# Patient Record
Sex: Male | Born: 1990 | Race: White | Hispanic: No | Marital: Single | Smoking: Current every day smoker
Health system: Southern US, Community
[De-identification: ages and names within clinical notes are randomized; demographics above are authoritative.]

## PROBLEM LIST (undated history)

## (undated) DIAGNOSIS — I10 Essential (primary) hypertension: Secondary | ICD-10-CM

## (undated) DIAGNOSIS — R011 Cardiac murmur, unspecified: Secondary | ICD-10-CM

## (undated) DIAGNOSIS — G43909 Migraine, unspecified, not intractable, without status migrainosus: Secondary | ICD-10-CM

## (undated) HISTORY — DX: Cardiac murmur, unspecified: R01.1

---
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
## 2010-09-14 ENCOUNTER — Emergency Department (HOSPITAL_COMMUNITY): Payer: No Typology Code available for payment source

## 2010-09-14 ENCOUNTER — Emergency Department (HOSPITAL_COMMUNITY)
Admission: EM | Admit: 2010-09-14 | Discharge: 2010-09-14 | Disposition: A | Discharge status: Home / Self Care | Payer: No Typology Code available for payment source | Attending: Emergency Medicine | Admitting: Emergency Medicine

## 2010-09-14 DIAGNOSIS — M542 Cervicalgia: Secondary | ICD-10-CM | POA: Insufficient documentation

## 2010-09-14 DIAGNOSIS — T1490XA Injury, unspecified, initial encounter: Secondary | ICD-10-CM | POA: Insufficient documentation

## 2010-09-14 DIAGNOSIS — Y92410 Unspecified street and highway as the place of occurrence of the external cause: Secondary | ICD-10-CM | POA: Insufficient documentation

## 2010-09-14 DIAGNOSIS — M62838 Other muscle spasm: Secondary | ICD-10-CM | POA: Insufficient documentation

## 2011-03-01 LAB — URINALYSIS, ROUTINE W REFLEX MICROSCOPIC
Bilirubin Urine: NEGATIVE
Glucose, UA: NEGATIVE
Hgb urine dipstick: NEGATIVE
Ketones, ur: NEGATIVE
Leukocytes, UA: NEGATIVE
Nitrite: NEGATIVE
Protein, ur: NEGATIVE
Specific Gravity, Urine: 1.018
Urobilinogen, UA: 1
pH: 8

## 2011-03-01 LAB — URINE MICROSCOPIC-ADD ON

## 2011-10-01 ENCOUNTER — Encounter: Payer: Self-pay | Admitting: *Deleted

## 2012-07-22 ENCOUNTER — Emergency Department (HOSPITAL_COMMUNITY)
Admission: EM | Admit: 2012-07-22 | Discharge: 2012-07-22 | Disposition: A | Discharge status: Home / Self Care | Payer: Self-pay | Attending: Emergency Medicine | Admitting: Emergency Medicine

## 2012-07-22 ENCOUNTER — Encounter (HOSPITAL_COMMUNITY): Payer: Self-pay | Admitting: Emergency Medicine

## 2012-07-22 DIAGNOSIS — R000 Tachycardia, unspecified: Secondary | ICD-10-CM | POA: Insufficient documentation

## 2012-07-22 DIAGNOSIS — R011 Cardiac murmur, unspecified: Secondary | ICD-10-CM | POA: Insufficient documentation

## 2012-07-22 DIAGNOSIS — E860 Dehydration: Secondary | ICD-10-CM | POA: Insufficient documentation

## 2012-07-22 DIAGNOSIS — R1084 Generalized abdominal pain: Secondary | ICD-10-CM | POA: Insufficient documentation

## 2012-07-22 DIAGNOSIS — F17200 Nicotine dependence, unspecified, uncomplicated: Secondary | ICD-10-CM | POA: Insufficient documentation

## 2012-07-22 DIAGNOSIS — Z8679 Personal history of other diseases of the circulatory system: Secondary | ICD-10-CM | POA: Insufficient documentation

## 2012-07-22 DIAGNOSIS — R112 Nausea with vomiting, unspecified: Secondary | ICD-10-CM | POA: Insufficient documentation

## 2012-07-22 HISTORY — DX: Migraine, unspecified, not intractable, without status migrainosus: G43.909

## 2012-07-22 LAB — URINALYSIS, MICROSCOPIC ONLY
Glucose, UA: NEGATIVE mg/dL
Hgb urine dipstick: NEGATIVE
Ketones, ur: 15 mg/dL — AB
pH: 5.5 (ref 5.0–8.0)

## 2012-07-22 LAB — COMPREHENSIVE METABOLIC PANEL
AST: 17 U/L (ref 0–37)
Albumin: 4.6 g/dL (ref 3.5–5.2)
Alkaline Phosphatase: 78 U/L (ref 39–117)
CO2: 25 mEq/L (ref 19–32)
Chloride: 99 mEq/L (ref 96–112)
Potassium: 3.5 mEq/L (ref 3.5–5.1)
Total Bilirubin: 1.1 mg/dL (ref 0.3–1.2)

## 2012-07-22 LAB — CBC WITH DIFFERENTIAL/PLATELET
Basophils Absolute: 0 10*3/uL (ref 0.0–0.1)
Basophils Relative: 0 % (ref 0–1)
Hemoglobin: 14.5 g/dL (ref 13.0–17.0)
Lymphocytes Relative: 6 % — ABNORMAL LOW (ref 12–46)
MCHC: 34.7 g/dL (ref 30.0–36.0)
Monocytes Relative: 5 % (ref 3–12)
Neutro Abs: 13.2 10*3/uL — ABNORMAL HIGH (ref 1.7–7.7)
Neutrophils Relative %: 89 % — ABNORMAL HIGH (ref 43–77)
RDW: 13.3 % (ref 11.5–15.5)
WBC: 15 10*3/uL — ABNORMAL HIGH (ref 4.0–10.5)

## 2012-07-22 MED ORDER — KETOROLAC TROMETHAMINE 30 MG/ML IJ SOLN
30.0000 mg | Freq: Once | INTRAMUSCULAR | Status: AC
  Administered 2012-07-22: 30 mg via INTRAVENOUS
  Filled 2012-07-22: qty 1

## 2012-07-22 MED ORDER — PROMETHAZINE HCL 25 MG PO TABS: 25.0000 mg | ORAL_TABLET | Freq: Four times a day (QID) | ORAL | Status: DC | PRN

## 2012-07-22 MED ORDER — ONDANSETRON 4 MG PO TBDP
8.0000 mg | ORAL_TABLET | Freq: Once | ORAL | Status: AC
  Administered 2012-07-22: 8 mg via ORAL
  Filled 2012-07-22: qty 2

## 2012-07-22 MED ORDER — METOCLOPRAMIDE HCL 5 MG/ML IJ SOLN
10.0000 mg | Freq: Once | INTRAMUSCULAR | Status: AC
  Administered 2012-07-22: 10 mg via INTRAVENOUS
  Filled 2012-07-22: qty 2

## 2012-07-22 MED ORDER — SODIUM CHLORIDE 0.9 % IV BOLUS (SEPSIS)
2000.0000 mL | Freq: Once | INTRAVENOUS | Status: AC
  Administered 2012-07-22: 2000 mL via INTRAVENOUS

## 2012-07-22 NOTE — ED Provider Notes (Signed)
History     CSN: [id number]  Arrival date & time 07/22/12  1629   First MD Initiated Contact with Patient 07/22/12 1915      Chief Complaint  Patient presents with  . nausea and vomiting     (Consider location/radiation/quality/duration/timing/severity/associated sxs/prior treatment) The history is provided by the patient.  Kenneth Garrison is a 22 y.o. male hx of migraines here with ab pain, vomiting. Route 3 days ago his son had vomiting. For the last 2 days his girlfriend also has been vomiting. As yesterday has perfuse vomiting and unable to keep anything down including fluids. He also has diffuse crampy abnormal pain. Denies any fevers or chills. No diarrhea or urinary symptoms.    Past Medical History  Diagnosis Date  . Heart murmur   . Migraines     History reviewed. No pertinent past surgical history.  Family History  Problem Relation Age of Onset  . Stroke Father   . Heart disease Father   . Diabetes Father     History  Substance Use Topics  . Smoking status: Current Every Day Smoker -- 0.25 packs/day  . Smokeless tobacco: Not on file  . Alcohol Use: No      Review of Systems  Gastrointestinal: Positive for vomiting and abdominal pain.  All other systems reviewed and are negative.    Allergies  Penicillins  Home Medications  No current outpatient prescriptions on file.  BP 144/79  Pulse 93  Temp(Src) 98.5 F (36.9 C) (Oral)  Resp 16  SpO2 98%  Physical Exam  Nursing note and vitals reviewed. Constitutional: He is oriented to person, place, and time. He appears well-developed and well-nourished.  HENT:  Head: Normocephalic.  MM dry   Eyes: Conjunctivae are normal. Pupils are equal, round, and reactive to light.  Neck: Normal range of motion. Neck supple.  Cardiovascular: Regular rhythm and normal heart sounds.   Slightly tachycardic   Pulmonary/Chest: Effort normal and breath sounds normal. No respiratory distress. He has no wheezes. He  has no rales.  Abdominal: Soft. Bowel sounds are normal. He exhibits no distension. There is no tenderness. There is no rebound and no guarding.  Musculoskeletal: Normal range of motion.  Neurological: He is alert and oriented to person, place, and time.  Skin: Skin is warm and dry.  Psychiatric: He has a normal mood and affect. His behavior is normal. Judgment and thought content normal.    ED Course  Procedures (including critical care time)  Labs Reviewed  CBC WITH DIFFERENTIAL - Abnormal; Notable for the following:    WBC 15.0 (*)    Neutrophils Relative 89 (*)    Neutro Abs 13.2 (*)    Lymphocytes Relative 6 (*)    All other components within normal limits  COMPREHENSIVE METABOLIC PANEL - Abnormal; Notable for the following:    Glucose, Bld 112 (*)    All other components within normal limits  URINALYSIS, MICROSCOPIC ONLY - Abnormal; Notable for the following:    Color, Urine AMBER (*)    APPearance HAZY (*)    Specific Gravity, Urine 1.031 (*)    Bilirubin Urine SMALL (*)    Ketones, ur 15 (*)    All other components within normal limits  LIPASE, BLOOD   No results found.   No diagnosis found.    MDM  Kenneth Garrison is a 22 y.o. male here with ab pain, vomiting. He likely has viral gastroenteritis given that he is sick contacts. WBC 15 is nonspecific  and his abdominal exam does not support appendicitis. We'll give fluids and antiemetics and reassess.   9:19 PM Patient tolerated PO, felt better after fluids. Abdomen soft and nontender. Likely gastroenteritis. Will d/c home on prn phenergan.        Richardean Canal, MD 07/22/12 2119

## 2012-07-22 NOTE — ED Notes (Signed)
Started vomiting last night at 10pm, states girlfriend has been sick for few days, child is also. Not able to keep anything down, muscles cramping in legs

## 2014-02-23 ENCOUNTER — Encounter (HOSPITAL_COMMUNITY): Payer: Self-pay | Admitting: Emergency Medicine

## 2014-02-23 ENCOUNTER — Emergency Department (HOSPITAL_COMMUNITY)
Admission: EM | Admit: 2014-02-23 | Discharge: 2014-02-23 | Disposition: A | Discharge status: Home / Self Care | Payer: No Typology Code available for payment source | Attending: Emergency Medicine | Admitting: Emergency Medicine

## 2014-02-23 DIAGNOSIS — R011 Cardiac murmur, unspecified: Secondary | ICD-10-CM | POA: Insufficient documentation

## 2014-02-23 DIAGNOSIS — Z8679 Personal history of other diseases of the circulatory system: Secondary | ICD-10-CM | POA: Insufficient documentation

## 2014-02-23 DIAGNOSIS — Z880 Allergy status to penicillin: Secondary | ICD-10-CM | POA: Insufficient documentation

## 2014-02-23 DIAGNOSIS — F17200 Nicotine dependence, unspecified, uncomplicated: Secondary | ICD-10-CM | POA: Insufficient documentation

## 2014-02-23 DIAGNOSIS — R030 Elevated blood-pressure reading, without diagnosis of hypertension: Secondary | ICD-10-CM | POA: Insufficient documentation

## 2014-02-23 DIAGNOSIS — R51 Headache: Secondary | ICD-10-CM | POA: Insufficient documentation

## 2014-02-23 LAB — I-STAT CHEM 8, ED
BUN: 14 mg/dL (ref 6–23)
CALCIUM ION: 1.25 mmol/L — AB (ref 1.12–1.23)
CHLORIDE: 104 meq/L (ref 96–112)
CREATININE: 0.9 mg/dL (ref 0.50–1.35)
Glucose, Bld: 95 mg/dL (ref 70–99)
HCT: 45 % (ref 39.0–52.0)
Hemoglobin: 15.3 g/dL (ref 13.0–17.0)
Potassium: 4.3 mEq/L (ref 3.7–5.3)
Sodium: 141 mEq/L (ref 137–147)
TCO2: 28 mmol/L (ref 0–100)

## 2014-02-23 MED ORDER — HYDROCHLOROTHIAZIDE 12.5 MG PO TABS: 12.5000 mg | ORAL_TABLET | Freq: Every day | ORAL | Status: DC

## 2014-02-23 MED ORDER — HYDROCHLOROTHIAZIDE 12.5 MG PO CAPS
12.5000 mg | ORAL_CAPSULE | Freq: Every day | ORAL | Status: DC
  Administered 2014-02-23: 12.5 mg via ORAL
  Filled 2014-02-23: qty 1

## 2014-02-23 MED ORDER — HYDROCHLOROTHIAZIDE 25 MG PO TABS: 12.5000 mg | ORAL_TABLET | Freq: Every day | ORAL | Status: DC

## 2014-02-23 NOTE — ED Notes (Signed)
Pt. Here b/c work said, "bp high."  Here to be medically evaluated that he is safe to go back to work.

## 2014-02-23 NOTE — ED Provider Notes (Signed)
Medical screening examination/treatment/procedure(s) were performed by non-physician practitioner and as supervising physician I was immediately available for consultation/collaboration.   EKG Interpretation   Date/Time:  Wednesday February 23 2014 12:15:24 EDT Ventricular Rate:  77 PR Interval:  160 QRS Duration: 130 QT Interval:  382 QTC Calculation: 432 R Axis:   87 Text Interpretation:  Normal sinus rhythm Non-specific intra-ventricular  conduction block Abnormal ECG No significant change since last tracing  Confirmed by Youlanda Tomassetti  MD, Lachandra Dettmann ([id number]) on 02/23/2014 2:43:13 PM        Richardean Canalavid H Takera Rayl, MD 02/23/14 [phone]

## 2014-02-23 NOTE — ED Provider Notes (Signed)
CSN: [id number]     Arrival date & time 02/23/14  1204 History  This chart was scribed for non-physician practitioner, Ladona MowJoe Kavonte Bearse, PA-C,working with Richardean Canalavid H Yao, MD, by Karle PlumberJennifer Tensley, ED Scribe. This patient was seen in room TR10C/TR10C and the patient's care was started at 1:36 PM.  Chief Complaint  Patient presents with  . Hypertension  . Headache   Patient is a 23 y.o. male presenting with hypertension and headaches. The history is provided by the patient. No language interpreter was used.  Hypertension Pertinent negatives include no chest pain, no headaches and no shortness of breath.  Headache Associated symptoms: no nausea and no vomiting    HPI Comments:  Kenneth Garrison is a 23 y.o. male with PMH of HTN and migraines who presents to the Emergency Department due to his blood pressure being high. Pt reports no associated symptoms. He states he went to work today and completed a stress test and was told his blood pressure was too high for their standards for the test. He denies SOB, CP, nausea, vomiting headache, dizziness or blurred vision. Pt is a smoker. He does not have a PCP. Pt reports h/o migraines but states they resolve with medication. He denies any illicit drug use. Reports occasional alcohol use. He has a family h/o HTN.  Past Medical History  Diagnosis Date  . Heart murmur   . Migraines    History reviewed. No pertinent past surgical history. Family History  Problem Relation Age of Onset  . Stroke Father   . Heart disease Father   . Diabetes Father    History  Substance Use Topics  . Smoking status: Current Every Day Smoker -- 0.25 packs/day  . Smokeless tobacco: Not on file  . Alcohol Use: No    Review of Systems  Eyes: Negative for visual disturbance.  Respiratory: Negative for shortness of breath.   Cardiovascular: Negative for chest pain.  Gastrointestinal: Negative for nausea and vomiting.  Neurological: Negative for syncope, speech difficulty,  weakness, light-headedness and headaches.    Allergies  Penicillins  Home Medications   Prior to Admission medications   Medication Sig Start Date End Date Taking? Authorizing Provider  hydrochlorothiazide (HYDRODIURIL) 25 MG tablet Take 0.5 tablets (12.5 mg total) by mouth daily. 02/23/14   Monte FantasiaJoseph W Payton Moder, PA-C  promethazine (PHENERGAN) 25 MG tablet Take 1 tablet (25 mg total) by mouth every 6 (six) hours as needed for nausea. 07/22/12   Richardean Canalavid H Yao, MD   Triage Vitals: BP 157/89  Pulse 77  Temp(Src) 98.1 F (36.7 C)  Resp 18  SpO2 99% Physical Exam  Nursing note and vitals reviewed. Constitutional: He is oriented to person, place, and time. He appears well-developed and well-nourished.  HENT:  Head: Normocephalic and atraumatic.  Eyes: Conjunctivae and EOM are normal. Pupils are equal, round, and reactive to light.  Neck: Normal range of motion.  Cardiovascular: Normal rate, regular rhythm and normal heart sounds.  Exam reveals no gallop and no friction rub.   No murmur heard. Pulses:      Radial pulses are 2+ on the right side, and 2+ on the left side.  Pulmonary/Chest: Effort normal and breath sounds normal. No respiratory distress. He has no wheezes. He has no rales. He exhibits no tenderness.  Abdominal: Soft. There is no tenderness.  Musculoskeletal: Normal range of motion.  Neurological: He is alert and oriented to person, place, and time.  Skin: Skin is warm and dry.  Psychiatric: He has a  normal mood and affect. His behavior is normal.    ED Course  Procedures (including critical care time) DIAGNOSTIC STUDIES: Oxygen Saturation is 99% on RA, normal by my interpretation.   COORDINATION OF CARE: 1:50 PM- Will recheck blood pressure and give resource guide to follow up with PCP. Pt verbalizes understanding and agrees to plan.  Medications  hydrochlorothiazide (MICROZIDE) capsule 12.5 mg (12.5 mg Oral Given 02/23/14 1505)    Labs Review Labs Reviewed  I-STAT  CHEM 8, ED - Abnormal; Notable for the following:    Calcium, Ion 1.25 (*)    All other components within normal limits    Imaging Review No results found.   EKG Interpretation   Date/Time:  Wednesday February 23 2014 12:15:24 EDT Ventricular Rate:  77 PR Interval:  160 QRS Duration: 130 QT Interval:  382 QTC Calculation: 432 R Axis:   87 Text Interpretation:  Normal sinus rhythm Non-specific intra-ventricular  conduction block Abnormal ECG No significant change since last tracing  Confirmed by YAO  MD, DAVID ([id number]) on 02/23/2014 2:43:13 PM      MDM   Final diagnoses:  Borderline high blood pressure   Patient here and elevated blood pressure. Patient has been hypertensive in the ER while he has been here, however remains asymptomatic. Patient alert, in no acute distress, nontoxic appearing the We will assess i-STAT chem 8 renal function, and place patient on HCTZ if renal function is normal so that he will be old to return to work with a controlled blood pressure.  I-STAT chem 8 reveals BUN of 14, creatinine of 0.90. We will discharge and give patient prescription for HCTZ with first dose here today. I strongly encouraged patient to find a primary care for provider to help followup with this in the next 2 weeks and to help assess if patient is on an appropriate antihypertensive regimen for him. I also strongly encouraged patient to quit smoking. I provided patient with strict return precautions, and resource guide to help find a primary care physician. I encouraged patient to call or return to the ER should he develop any symptoms, or should he have any questions or concerns.  BP 142/91  Pulse 72  Temp(Src) 98.1 F (36.7 C)  Resp 16  SpO2 99%  Signed,  Ladona Mow, PA-C 6:30 PM   I personally performed the services described in this documentation, which was scribed in my presence. The recorded information has been reviewed and is accurate.    Monte Fantasia,  PA-C 02/23/14 [phone]

## 2014-02-23 NOTE — ED Notes (Signed)
Talked with DR. Silverio LayYao regarding chief complaint. Pt. Can be seen in fast track.

## 2014-02-23 NOTE — Discharge Instructions (Signed)
Your blood pressure was elevated today. Followup with primary care physician for diagnosis or rule out of hypertension. Use hydrochlorothiazide one tablet once a day for high blood pressure. Return to the ER if you develop any chest pain, shortness of breath, dizziness, blurred vision, nausea, vomiting.   Hypertension Hypertension, commonly called high blood pressure, is when the force of blood pumping through your arteries is too strong. Your arteries are the blood vessels that carry blood from your heart throughout your body. A blood pressure reading consists of a higher number over a lower number, such as 110/72. The higher number (systolic) is the pressure inside your arteries when your heart pumps. The lower number (diastolic) is the pressure inside your arteries when your heart relaxes. Ideally you want your blood pressure below 120/80. Hypertension forces your heart to work harder to pump blood. Your arteries may become narrow or stiff. Having hypertension puts you at risk for heart disease, stroke, and other problems.  RISK FACTORS Some risk factors for high blood pressure are controllable. Others are not.  Risk factors you cannot control include:   Race. You may be at higher risk if you are African American.  Age. Risk increases with age.  Gender. Men are at higher risk than women before age 33 years. After age 43, women are at higher risk than men. Risk factors you can control include:  Not getting enough exercise or physical activity.  Being overweight.  Getting too much fat, sugar, calories, or salt in your diet.  Drinking too much alcohol. SIGNS AND SYMPTOMS Hypertension does not usually cause signs or symptoms. Extremely high blood pressure (hypertensive crisis) may cause headache, anxiety, shortness of breath, and nosebleed. DIAGNOSIS  To check if you have hypertension, your health care provider will measure your blood pressure while you are seated, with your arm held at the  level of your heart. It should be measured at least twice using the same arm. Certain conditions can cause a difference in blood pressure between your right and left arms. A blood pressure reading that is higher than normal on one occasion does not mean that you need treatment. If one blood pressure reading is high, ask your health care provider about having it checked again. TREATMENT  Treating high blood pressure includes making lifestyle changes and possibly taking medicine. Living a healthy lifestyle can help lower high blood pressure. You may need to change some of your habits. Lifestyle changes may include:  Following the DASH diet. This diet is high in fruits, vegetables, and whole grains. It is low in salt, red meat, and added sugars.  Getting at least 2 hours of brisk physical activity every week.  Losing weight if necessary.  Not smoking.  Limiting alcoholic beverages.  Learning ways to reduce stress. If lifestyle changes are not enough to get your blood pressure under control, your health care provider may prescribe medicine. You may need to take more than one. Work closely with your health care provider to understand the risks and benefits. HOME CARE INSTRUCTIONS  Have your blood pressure rechecked as directed by your health care provider.   Take medicines only as directed by your health care provider. Follow the directions carefully. Blood pressure medicines must be taken as prescribed. The medicine does not work as well when you skip doses. Skipping doses also puts you at risk for problems.   Do not smoke.   Monitor your blood pressure at home as directed by your health care provider. SEEK MEDICAL  CARE IF:   You think you are having a reaction to medicines taken.  You have recurrent headaches or feel dizzy.  You have swelling in your ankles.  You have trouble with your vision. SEEK IMMEDIATE MEDICAL CARE IF:  You develop a severe headache or confusion.  You  have unusual weakness, numbness, or feel faint.  You have severe chest or abdominal pain.  You vomit repeatedly.  You have trouble breathing. MAKE SURE YOU:   Understand these instructions.  Will watch your condition.  Will get help right away if you are not doing well or get worse. Document Released: 05/13/2005 Document Revised: 09/27/2013 Document Reviewed: 03/05/2013 [facility] Patient Information 2015 [place], [place]. This information is not intended to replace advice given to you by your health care provider. Make sure you discuss any questions you have with your health care provider.   Emergency Department Resource Guide 1) Find a Doctor and Pay Out of Pocket Although you won't have to find out who is covered by your insurance plan, it is a good idea to ask around and get recommendations. You will then need to call the office and see if the doctor you have chosen will accept you as a new patient and what types of options they offer for patients who are self-pay. Some doctors offer discounts or will set up payment plans for their patients who do not have insurance, but you will need to ask so you aren't surprised when you get to your appointment.  2) Contact Your Local Health Department Not all health departments have doctors that can see patients for sick visits, but many do, so it is worth a call to see if yours does. If you don't know where your local health department is, you can check in your phone book. The CDC also has a tool to help you locate your state's health department, and many state websites also have listings of all of their local health departments.  3) Find a Walk-in Clinic If your illness is not likely to be very severe or complicated, you may want to try a walk in clinic. These are popping up all over the country in pharmacies, drugstores, and shopping centers. They're usually staffed by nurse practitioners or physician assistants that have been trained to treat  common illnesses and complaints. They're usually fairly quick and inexpensive. However, if you have serious medical issues or chronic medical problems, these are probably not your best option.  No Primary Care Doctor: - Call Health Connect at  [phone] - they can help you locate a primary care doctor that  accepts your insurance, provides certain services, etc. - Physician Referral Service- [phone]  Chronic Pain Problems: Organization         Address  Phone   Notes  [facility]  [phone] Patients need to be referred by their primary care doctor.   Medication Assistance: Organization         Address  Phone   Notes  [facility] Medication [facility] [address] [place], [place] [address] [phone] --Must be a resident of [facility] -- Must have NO insurance coverage whatsoever (no Medicaid/ Medicare, etc.) -- The pt. MUST have a primary care doctor that directs their care regularly and follows them in the community   MedAssist  [phone]   [employer]  [phone]    Agencies that provide inexpensive medical care: Organization  Address  Phone   Notes  [facility]  [phone]) [phone]   [facility]    [phone]) [phone]   [facility] [address] [place], [place] [address] [phone]) [phone]   [facility] [address], [place] [phone]) [phone]   [facility]anned Parenthood    [phone]   [facility]    [phone](336) [phone]   [facility]  [address], [place] Phone:  [phone](336) [phone], Fax:  [phone] Hours of Operation:  9 am - 6 pm, M-F.  Also accepts Medicaid/Medicare and self-pay.  [facility]  [address], [place] Phone: [phone](336) [phone], Fax: [phone](336) [phone]. Hours of Operation:  8:30 am - 5:30 pm, M-F.  Also accepts Medicaid and self-pay.  [facility] [place] [address], [place] Phone: [phone]) [phone]   [facility] [address], [place], [place] [phone], Ext. [phone] Mondays & Thursdays: 7-9 AM.  First 15 patients are seen on a first come, first serve basis.    Medicaid-accepting [facility] Providers:  Organization         Address  Phone   Notes  [facility] [address], [place] [phone] Also accepts self-pay patients.  [facility] [address], [place]  [phone]) [phone]   [facility] [address], [place] [phone]) [phone]   [facility] [address] [place] [address], [place] [phone]) [phone]   Renaye RakersVeita Bland [address], [place]   [phone]) [phone] Only accepts [place] Access [place] patients after they have their name applied to their card.   Self-Pay (no insurance) in [facility]:  Organization         Address  Phone   Notes  Sickle Cell Patients, [facility] [address], [place] [phone]) [phone]   [facility] [address], [place] [phone]) [phone]   [facility]  [address], [place] [phone]) [phone]   [facility]/Dr. Osei-Bonsu  [address] [place] [address], [place] or [phone], [place] [phone] Phone number for both [place] and [place] locations is the same.  [facility] [address], [place] [phone](336) [phone]   [facility]  [address] [place] [address], [place] or [address] [phone]) [phone]) [phone]   [facility] [address], [place] [phone]) [phone], phone; [phone]) [phone], fax Sees patients 1st and 3rd Saturday of every month.  Must not qualify for public or private insurance (i.e. Medicaid, Medicare, [place] Health Choice, Veterans' Benefits)  Household income should be no more than 200% of the  poverty level The clinic cannot treat you if you are pregnant or think you are pregnant  Sexually transmitted diseases are not treated at the clinic.    Dental Care: Organization         Address  Phone  Notes  [facility] Department of [facility] [address], [place] [phone] Accepts children up to age 24 who are enrolled in [place] or [place] Health Choice; pregnant women with a Medicaid card; and children who have applied for Medicaid or [place] Health Choice, but were declined, whose parents can pay a reduced fee at time  of service.  [facility]  [address], [place] [phone] Accepts children up to age 75 who are enrolled in [place] or [place] Health Choice; pregnant women with a Medicaid card; and children who have applied for Medicaid or [place] Health Choice, but were declined, whose parents can pay a reduced fee at time of service.  [facility]  [address] [place], [place] [phone] Patients are seen by appointment only. Walk-ins are not accepted. [facility] will see patients 74 years of age and older. Monday - Tuesday (8am-5pm) Most Wednesdays (8:30-5pm) $30 per visit, cash only  [facility] Adult Dental Access PROGRAM  [address], [facility] [phone] Patients are seen by appointment only. Walk-ins are not accepted. [facility] will see patients 72 years of age and older. One Wednesday Evening (Monthly: Volunteer Based).  $30 per visit, cash only  [employer]  [phone] for adults; Children under age 51, call Graduate Pediatric Dentistry at [phone]. Children aged 105-14, please call [phone] to request a pediatric application.  Dental services are provided in all areas of dental care including fillings, crowns and bridges, complete and partial dentures, implants, gum treatment, root canals, and extractions.  Preventive care is also provided. Treatment is provided to both adults and children. Patients are selected via a lottery and there is often a waiting list.   [facility] [address], [place]  [phone] [email or web address]   [facility] [address] [place], [place] [phone], Ext. [phone] Second and Fourth Thursday of each month, opens at 6:30 AM; Clinic ends at 9 AM.  Patients are seen on a first-come first-served basis, and a limited number are seen during each clinic.   [facility]  [address] [place], [place] [phone]   Eligibility Requirements You must have lived in [place], [place], or [place] counties for at least the last three months.   You cannot be eligible for state or federal sponsored [place], including [employer], [place], or [employer].   You generally cannot be eligible for healthcare insurance through your employer.    How to apply: Eligibility screenings are held every Tuesday and Wednesday afternoon from 1:00 pm until 4:00 pm. You do not need an appointment for the interview!  [facility] [address] [place], [place] [phone]   [facility] Health Department  [phone]   [facility] Health Department  [phone]   [facility] Health Department  [phone]    Behavioral Health Resources in the Community: Intensive Outpatient Programs Organization         Address  Phone  Notes  [facility] [address] [place], [place] [phone]   [facility] [address], [place], [place] [phone]   [facility]: [facility] [address] [place], [place]  [phone]   [facility] [address]  [place], [place] [phone] or [phone]   Substance Abuse Resources Organization         Address  Phone  Notes  Alcohol and Drug Services  [phone]   Addiction  [facility]  [phone]   [facility] [place]  [phone]   Floydene Flock  [phone]   Residential & Outpatient Substance Abuse Program  [phone]   Psychological Services Organization         Address  Phone  Notes  [facility]  [phone]- [phone]   [facility]  [phone]- [phone]   [facility] [address], [place] [phone] or [phone]    Mobile Crisis Teams Organization         Address  Phone  Notes  [facility], Mobile Crisis Care Unit  [phone]   [facility]  [address] [place], [place] [phone]   Doristine LocksSharon DeEsch [address] [place] [phone]    Self-Help/Support Groups Organization         Address  Phone             Notes  [facility] [place] - variety of support groups  336- [phone] Call for more information  Narcotics Anonymous (NA), [facility] [address], [place]  2 meetings at this location   Statisticianesidential Treatment Programs Organization         Address  Phone  Notes  [facility] [address],    [place]  [phone]   [facility]  [address], [place] [phone], [place], [place] [phone]   [facility] [address], [place] [phone] Admissions: 8am-3pm M-F  [facility] [address]    [place], [place] [phone]   [facility] [address], [place], [place] [phone]   [facility] [address]  [place], [place] [phone]   [facility] - Intensive Outpatient [address], [place], [place] [phone]   [facility] ([facility] [address]  [place], [place] [phone] or [phone]   [facility] (RTS) [address] [place], [place] [phone] Accepts Medicaid  [facility] [address]  [place]  [phone] Substance Abuse/Addiction Treatment   [facility] Behavioral Health Resources Organization         Address  Phone  Notes  [facility]  [phone]) [phone]   Angie FavaJulie Brannon, PhD [address], [address] [place], [place]   [phone]) [phone] or [phone](336) [phone]   [facility]    [address] [place], [place] [phone](336) [phone]   [facility] [address], [place], [place] [phone]) [phone] Insurance/Medicaid/sponsorship through [facility]  [facility] [address]                                    [place], [place] [phone](336) [phone] Therapy/tele-psych/case  [facility] [address]   [place], [place] [phone](336) [phone]    Dr. Lolly MustacheArfeen  [phone]) [phone]   [facility] [place]  [employer] [facility] 1) [address], [place] 2) [address], [place] 3)  [address], [place] [phone](336) [phone]  [phone](336) [phone]   [facility] Child Abuse Hotline [phone](336) [phone] or [phone](336) [phone] (After Hours)

## 2014-10-14 ENCOUNTER — Emergency Department (HOSPITAL_COMMUNITY)
Admission: EM | Admit: 2014-10-14 | Discharge: 2014-10-14 | Disposition: A | Discharge status: Home / Self Care | Payer: No Typology Code available for payment source | Attending: Emergency Medicine | Admitting: Emergency Medicine

## 2014-10-14 ENCOUNTER — Encounter (HOSPITAL_COMMUNITY): Payer: Self-pay | Admitting: Neurology

## 2014-10-14 DIAGNOSIS — R011 Cardiac murmur, unspecified: Secondary | ICD-10-CM | POA: Insufficient documentation

## 2014-10-14 DIAGNOSIS — Y998 Other external cause status: Secondary | ICD-10-CM | POA: Diagnosis not present

## 2014-10-14 DIAGNOSIS — Y9389 Activity, other specified: Secondary | ICD-10-CM | POA: Insufficient documentation

## 2014-10-14 DIAGNOSIS — S4991XA Unspecified injury of right shoulder and upper arm, initial encounter: Secondary | ICD-10-CM | POA: Diagnosis not present

## 2014-10-14 DIAGNOSIS — Z79899 Other long term (current) drug therapy: Secondary | ICD-10-CM | POA: Insufficient documentation

## 2014-10-14 DIAGNOSIS — I10 Essential (primary) hypertension: Secondary | ICD-10-CM | POA: Insufficient documentation

## 2014-10-14 DIAGNOSIS — Z880 Allergy status to penicillin: Secondary | ICD-10-CM | POA: Insufficient documentation

## 2014-10-14 DIAGNOSIS — M62830 Muscle spasm of back: Secondary | ICD-10-CM | POA: Insufficient documentation

## 2014-10-14 DIAGNOSIS — S199XXA Unspecified injury of neck, initial encounter: Secondary | ICD-10-CM | POA: Diagnosis not present

## 2014-10-14 DIAGNOSIS — Y92410 Unspecified street and highway as the place of occurrence of the external cause: Secondary | ICD-10-CM | POA: Diagnosis not present

## 2014-10-14 DIAGNOSIS — Z720 Tobacco use: Secondary | ICD-10-CM | POA: Diagnosis not present

## 2014-10-14 DIAGNOSIS — S3992XA Unspecified injury of lower back, initial encounter: Secondary | ICD-10-CM | POA: Diagnosis present

## 2014-10-14 MED ORDER — CYCLOBENZAPRINE HCL 10 MG PO TABS: 10.0000 mg | ORAL_TABLET | Freq: Two times a day (BID) | ORAL | Status: DC | PRN

## 2014-10-14 MED ORDER — NAPROXEN 500 MG PO TABS: 500.0000 mg | ORAL_TABLET | Freq: Two times a day (BID) | ORAL | Status: DC

## 2014-10-14 MED ORDER — HYDROCHLOROTHIAZIDE 25 MG PO TABS: 12.5000 mg | ORAL_TABLET | Freq: Every day | ORAL | Status: DC
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
## 2015-05-10 ENCOUNTER — Emergency Department (HOSPITAL_COMMUNITY)
Admission: EM | Admit: 2015-05-10 | Discharge: 2015-05-10 | Disposition: A | Discharge status: Home / Self Care | Payer: Self-pay | Attending: Emergency Medicine | Admitting: Emergency Medicine

## 2015-05-10 ENCOUNTER — Encounter (HOSPITAL_COMMUNITY): Payer: Self-pay | Admitting: Emergency Medicine

## 2015-05-10 DIAGNOSIS — J029 Acute pharyngitis, unspecified: Secondary | ICD-10-CM | POA: Insufficient documentation

## 2015-05-10 DIAGNOSIS — H73893 Other specified disorders of tympanic membrane, bilateral: Secondary | ICD-10-CM | POA: Insufficient documentation

## 2015-05-10 DIAGNOSIS — Z880 Allergy status to penicillin: Secondary | ICD-10-CM | POA: Insufficient documentation

## 2015-05-10 DIAGNOSIS — R011 Cardiac murmur, unspecified: Secondary | ICD-10-CM | POA: Insufficient documentation

## 2015-05-10 DIAGNOSIS — F17200 Nicotine dependence, unspecified, uncomplicated: Secondary | ICD-10-CM | POA: Insufficient documentation

## 2015-05-10 DIAGNOSIS — R112 Nausea with vomiting, unspecified: Secondary | ICD-10-CM | POA: Insufficient documentation

## 2015-05-10 DIAGNOSIS — R000 Tachycardia, unspecified: Secondary | ICD-10-CM | POA: Insufficient documentation

## 2015-05-10 DIAGNOSIS — Z8679 Personal history of other diseases of the circulatory system: Secondary | ICD-10-CM | POA: Insufficient documentation

## 2015-05-10 LAB — RAPID STREP SCREEN (MED CTR MEBANE ONLY): STREPTOCOCCUS, GROUP A SCREEN (DIRECT): NEGATIVE

## 2015-05-10 MED ORDER — CHLORHEXIDINE GLUCONATE 0.12 % MT SOLN: 15.0000 mL | Freq: Two times a day (BID) | OROMUCOSAL | Status: DC

## 2015-05-10 MED ORDER — ACETAMINOPHEN-CODEINE #3 300-30 MG PO TABS: 1.0000 | ORAL_TABLET | Freq: Four times a day (QID) | ORAL | Status: DC | PRN

## 2015-05-10 MED ORDER — DEXAMETHASONE SODIUM PHOSPHATE 10 MG/ML IJ SOLN
10.0000 mg | Freq: Once | INTRAMUSCULAR | Status: AC
  Administered 2015-05-10: 10 mg via INTRAMUSCULAR
  Filled 2015-05-10: qty 1

## 2015-05-10 MED ORDER — MAGIC MOUTHWASH
10.0000 mL | Freq: Once | ORAL | Status: AC
  Administered 2015-05-10: 10 mL via ORAL
  Filled 2015-05-10: qty 10

## 2015-05-10 MED ORDER — PREDNISONE 20 MG PO TABS: ORAL_TABLET | ORAL | Status: DC

## 2015-05-10 MED ORDER — ACETAMINOPHEN 325 MG PO TABS
650.0000 mg | ORAL_TABLET | Freq: Once | ORAL | Status: AC | PRN
  Administered 2015-05-10: 650 mg via ORAL
  Filled 2015-05-10: qty 2

## 2015-05-10 NOTE — Progress Notes (Addendum)
CM spoke with [place] who [place].  CM discussed and provided written information for uninsured accepting pcps, discussed the importance of pcp vs EDP services for f/u care, www.needymeds.org, www.goodrx.com, discounted pharmacies and other [place] resources such as [facility] , [facility], affordable care act, financial assistance, uninsured dental services, [place] med assist, DSS and  health department  Reviewed resources for [place] uninsured accepting pcps like [facility], family medicine [facility] [address], [facility], [facility], local urgent care centers, [facility], [facility], general medical clinics, [facility] services of the [place], [facility] urgent care plus others, medication resources, [facility] out patient pharmacies and housing Pt voiced understanding and appreciation of resources provided  Resources given to male visitor at the bedside  Provided [facility] contact information Pt agreed to a referral Cm completed referral Pt to be contact by [facility] clinical liason  Entered in d.c instructions  Please use the resources provided to you in emergency room by case manager to assist with doctor for follow up A referral for you has been sent to [facility] if you have not received a call in 3 days you may contact them Call Scherry RanKaren Andrianos at [phone] Tuesday-Friday [email or web address] These [place] uninsured resources provide possible primary care providers, resources for discounted medications, housing, dental resources, affordable care act information, plus other resources for [place]

## 2015-05-10 NOTE — ED Notes (Signed)
Discharge instructions and rx x3 reviewed with patient. Patient verbalized understanding. 

## 2015-05-10 NOTE — ED Provider Notes (Signed)
CSN: [id number]     Arrival date & time 05/10/15  1418 History   None    Chief Complaint  Patient presents with  . Sore Throat  . Nausea  . Emesis     (Consider location/radiation/quality/duration/timing/severity/associated sxs/prior Treatment) HPI   24 year old male with history of heart murmur who presents for evaluation of sore throat. Patient report for the past week he has had fever, chills, headache, sore throat, runny nose, sneezing, congestion, occasional cough, myalgias. For the past several days he also endorsed nausea and has vomited twice with trace of blood in his emesis. He is having difficulty swallowing but able to tolerates his saliva. He has been using over-the-counter medication including TheraFlu, Tylenol, NyQuil as well as a short course of steroids that was left over from his girlfriend without adequate relief. The symptom has been persistent. He is a smoker. He did not call recall any specific sick contact. He denies evidence of abdominal pain or rash.  Past Medical History  Diagnosis Date  . Heart murmur   . Migraines    No past surgical history on file. Family History  Problem Relation Age of Onset  . Stroke Father   . Heart disease Father   . Diabetes Father    Social History  Substance Use Topics  . Smoking status: Current Every Day Smoker -- 0.25 packs/day  . Smokeless tobacco: None  . Alcohol Use: No    Review of Systems  All other systems reviewed and are negative.     Allergies  Penicillins  Home Medications   Prior to Admission medications   Medication Sig Start Date End Date Taking? Authorizing Provider  hydrochlorothiazide (HYDRODIURIL) 25 MG tablet Take 0.5 tablets (12.5 mg total) by mouth daily. Patient not taking: Reported on 05/10/2015 10/14/14   Arthor Captain, PA-C   BP 145/90 mmHg  Pulse 106  Temp(Src) 101.8 F (38.8 C) (Oral)  Resp 20  SpO2 100% Physical Exam  Constitutional: He appears well-developed and  well-nourished. No distress.  Nontoxic in appearance  HENT:  Head: Atraumatic.  Ears: Effusion noted to bilateral TM without erythema Nose: Mild rhinorrhea Throat: Uvula is midline, bilateral tonsillar enlargement with exudates along with posterior oropharyngeal erythema but no trismus  Eyes: Conjunctivae are normal.  Neck: Neck supple.  No nuchal rigidity  Cardiovascular:  Murmur heard. Mild tachycardia with faint systolic murmur, no rubs or gallops  Pulmonary/Chest: Effort normal and breath sounds normal. He has no wheezes. He has no rales.  Abdominal: Soft. There is no tenderness.  No hepatomegaly or splenomegaly  Lymphadenopathy:    He has cervical adenopathy.  Neurological: He is alert.  Skin: No rash noted.  Psychiatric: He has a normal mood and affect.  Nursing note and vitals reviewed.   ED Course  Procedures (including critical care time) Labs Review Labs Reviewed  RAPID STREP SCREEN (NOT AT [facility])  CULTURE, GROUP A STREP     MDM   Final diagnoses:  Viral pharyngitis    BP 147/82 mmHg  Pulse 93  Temp(Src) 100.3 F (37.9 C) (Oral)  Resp 16  SpO2 100%   3:31 PM Patient presents with flulike symptoms. He is febrile with temperature of 101.8 and tachycardia secondary to his fever. His lungs are clear but he does have bilateral tonsillar enlargement with exudates. No evidence of peritonsillar abscess or deep tissue infection. He is protecting his airway. Rapid strep test is negative. Throat culture sent. I'll give patient a course of steroid, and magic mouthwash  for symptomatic treatment.  5:25 PM Temperature improves after patient receiving Tylenol. Strep test is negative. Patient felt better after receiving magic mouthwash and steroid. Suspect viral pharyngitis/flulike symptoms. ENT referral given as needed. Return precaution discussed.  Fayrene HelperBowie Helyne Genther, PA-C 05/10/15 1728  Arby BarretteMarcy Pfeiffer, MD 05/15/15 [phone]

## 2015-05-10 NOTE — ED Notes (Signed)
Pt states that he has been having fever, sore throat, vomiting since Friday.  Pt has +2/3 tonsils.

## 2015-05-10 NOTE — Discharge Instructions (Signed)
Pharyngitis Pharyngitis is a sore throat (pharynx). There is redness, pain, and swelling of your throat. HOME CARE   Drink enough fluids to keep your pee (urine) clear or pale yellow.  Only take medicine as told by your doctor.  You may get sick again if you do not take medicine as told. Finish your medicines, even if you start to feel better.  Do not take aspirin.  Rest.  Rinse your mouth (gargle) with salt water ( tsp of salt per 1 qt of water) every 1-2 hours. This will help the pain.  If you are not at risk for choking, you can suck on hard candy or sore throat lozenges. GET HELP IF:  You have large, tender lumps on your neck.  You have a rash.  You cough up green, yellow-brown, or bloody spit. GET HELP RIGHT AWAY IF:   You have a stiff neck.  You drool or cannot swallow liquids.  You throw up (vomit) or are not able to keep medicine or liquids down.  You have very bad pain that does not go away with medicine.  You have problems breathing (not from a stuffy nose). MAKE SURE YOU:   Understand these instructions.  Will watch your condition.  Will get help right away if you are not doing well or get worse.   This information is not intended to replace advice given to you by your health care provider. Make sure you discuss any questions you have with your health care provider.   Document Released: 10/30/2007 Document Revised: 03/03/2013 Document Reviewed: 01/18/2013 Elsevier Interactive Patient Education 2016 Elsevier Inc.   Viral Infections A viral infection can be caused by different types of viruses.Most viral infections are not serious and resolve on their own. However, some infections may cause severe symptoms and may lead to further complications. SYMPTOMS Viruses can frequently cause:  Minor sore throat.  Aches and pains.  Headaches.  Runny nose.  Different types of rashes.  Watery eyes.  Tiredness.  Cough.  Loss of  appetite.  Gastrointestinal infections, resulting in nausea, vomiting, and diarrhea. These symptoms do not respond to antibiotics because the infection is not caused by bacteria. However, you might catch a bacterial infection following the viral infection. This is sometimes called a "superinfection." Symptoms of such a bacterial infection may include:  Worsening sore throat with pus and difficulty swallowing.  Swollen neck glands.  Chills and a high or persistent fever.  Severe headache.  Tenderness over the sinuses.  Persistent overall ill feeling (malaise), muscle aches, and tiredness (fatigue).  Persistent cough.  Yellow, green, or brown mucus production with coughing. HOME CARE INSTRUCTIONS   Only take over-the-counter or prescription medicines for pain, discomfort, diarrhea, or fever as directed by your caregiver.  Drink enough water and fluids to keep your urine clear or pale yellow. Sports drinks can provide valuable electrolytes, sugars, and hydration.  Get plenty of rest and maintain proper nutrition. Soups and broths with crackers or rice are fine. SEEK IMMEDIATE MEDICAL CARE IF:   You have severe headaches, shortness of breath, chest pain, neck pain, or an unusual rash.  You have uncontrolled vomiting, diarrhea, or you are unable to keep down fluids.  You or your child has an oral temperature above 102 F (38.9 C), not controlled by medicine.  Your baby is older than 3 months with a rectal temperature of 102 F (38.9 C) or higher.  Your baby is 643 months old or younger with a rectal temperature of  38° C) or higher. °MAKE SURE YOU:  °· Understand these instructions. °· Will watch your condition. °· Will get help right away if you are not doing well or get worse. °  °This information is not intended to replace advice given to you by your health care provider. Make sure you discuss any questions you have with your health care provider. °  °Document Released:  02/20/2005 Document Revised: 08/05/2011 Document Reviewed: 10/19/2014 °Elsevier Interactive Patient Education ©2016 Elsevier Inc. ° °

## 2015-05-13 LAB — CULTURE, GROUP A STREP: STREP A CULTURE: NEGATIVE

## 2017-06-23 ENCOUNTER — Emergency Department (HOSPITAL_COMMUNITY)
Admission: EM | Admit: 2017-06-23 | Discharge: 2017-06-23 | Disposition: A | Discharge status: Home / Self Care | Payer: PRIVATE HEALTH INSURANCE | Attending: Emergency Medicine | Admitting: Emergency Medicine

## 2017-06-23 DIAGNOSIS — F17200 Nicotine dependence, unspecified, uncomplicated: Secondary | ICD-10-CM | POA: Diagnosis not present

## 2017-06-23 DIAGNOSIS — Z79899 Other long term (current) drug therapy: Secondary | ICD-10-CM | POA: Diagnosis not present

## 2017-06-23 DIAGNOSIS — K649 Unspecified hemorrhoids: Secondary | ICD-10-CM | POA: Diagnosis not present

## 2017-06-23 DIAGNOSIS — K625 Hemorrhage of anus and rectum: Secondary | ICD-10-CM | POA: Diagnosis present

## 2017-06-23 LAB — CBC WITH DIFFERENTIAL/PLATELET
BASOS ABS: 0 10*3/uL (ref 0.0–0.1)
BASOS PCT: 0 %
Eosinophils Absolute: 0.1 10*3/uL (ref 0.0–0.7)
Eosinophils Relative: 1 %
HCT: 38.8 % — ABNORMAL LOW (ref 39.0–52.0)
Hemoglobin: 13.1 g/dL (ref 13.0–17.0)
Lymphocytes Relative: 33 %
Lymphs Abs: 2.6 10*3/uL (ref 0.7–4.0)
MCH: 30.8 pg (ref 26.0–34.0)
MCHC: 33.8 g/dL (ref 30.0–36.0)
MCV: 91.1 fL (ref 78.0–100.0)
MONO ABS: 0.7 10*3/uL (ref 0.1–1.0)
MONOS PCT: 8 %
NEUTROS PCT: 58 %
Neutro Abs: 4.6 10*3/uL (ref 1.7–7.7)
Platelets: 299 10*3/uL (ref 150–400)
RBC: 4.26 MIL/uL (ref 4.22–5.81)
RDW: 13 % (ref 11.5–15.5)
WBC: 7.9 10*3/uL (ref 4.0–10.5)

## 2017-06-23 LAB — BASIC METABOLIC PANEL
Anion gap: 6 (ref 5–15)
BUN: 12 mg/dL (ref 6–20)
CALCIUM: 9.2 mg/dL (ref 8.9–10.3)
CO2: 29 mmol/L (ref 22–32)
CREATININE: 0.91 mg/dL (ref 0.61–1.24)
Chloride: 105 mmol/L (ref 101–111)
Glucose, Bld: 105 mg/dL — ABNORMAL HIGH (ref 65–99)
Potassium: 4.6 mmol/L (ref 3.5–5.1)
SODIUM: 140 mmol/L (ref 135–145)

## 2017-06-23 MED ORDER — DOCUSATE SODIUM 100 MG PO CAPS: 100.0000 mg | ORAL_CAPSULE | Freq: Two times a day (BID) | ORAL | 0 refills | Status: AC

## 2017-06-23 MED ORDER — HYDROCORTISONE 2.5 % RE CREA: TOPICAL_CREAM | RECTAL | 2 refills | Status: AC

## 2017-06-23 NOTE — ED Triage Notes (Signed)
Pt complains of rectal bleeding for the past 3 days. Pt states he has pain in his anus and lower abdomen. Pt states the blood is bright red. Pt has tried 800mg  ibuprofen.

## 2017-06-23 NOTE — ED Provider Notes (Signed)
[facility]-EMERGENCY DEPT Provider Note   CSN: [id number] Arrival date & time: 06/23/17  1137     History   Chief Complaint Chief Complaint  Patient presents with  . Rectal Bleeding  . Abdominal Pain    HPI Kenneth Garrison is a 27 y.o. male.  The history is provided by the patient. No language interpreter was used.  Rectal Bleeding  Quality:  Bright red Amount:  Moderate Duration:  3 days Timing:  Constant Chronicity:  New Similar prior episodes: no   Relieved by:  Nothing Worsened by:  Nothing Ineffective treatments:  None tried Associated symptoms: abdominal pain   Risk factors: no anticoagulant use   Abdominal Pain   Associated symptoms include hematochezia.  Pt reports he has had a history of blood with bowel movements.  Pt complains of bleeding and rectal pain.   Past Medical History:  Diagnosis Date  . Heart murmur   . Migraines     There are no active problems to display for this patient.   No past surgical history on file.     Home Medications    Prior to Admission medications   Medication Sig Start Date End Date Taking? Authorizing Provider  clonazePAM (KLONOPIN) 0.5 MG tablet Take 0.5 mg by mouth once.   Yes [provider]  ibuprofen (ADVIL,MOTRIN) 800 MG tablet Take 800 mg by mouth every 8 (eight) hours as needed for moderate pain.   Yes [provider]  Multiple Vitamin (MULTIVITAMIN WITH MINERALS) TABS tablet Take 1 tablet by mouth daily.   Yes [provider]  acetaminophen-codeine (TYLENOL #3) 300-30 MG tablet Take 1-2 tablets by mouth every 6 (six) hours as needed for moderate pain. Patient not taking: Reported on 06/23/2017 05/10/15   Fayrene Helper, PA-C  chlorhexidine (PERIDEX) 0.12 % solution Use as directed 15 mLs in the mouth or throat 2 (two) times daily. Patient not taking: Reported on 06/23/2017 05/10/15   Fayrene Helper, PA-C  hydrochlorothiazide (HYDRODIURIL) 25 MG tablet Take 0.5 tablets  (12.5 mg total) by mouth daily. Patient not taking: Reported on 05/10/2015 10/14/14   Arthor Captain, PA-C  predniSONE (DELTASONE) 20 MG tablet 3 tabs po day one, then 2 tabs daily x 4 days Patient not taking: Reported on 06/23/2017 05/10/15   Fayrene Helper, PA-C    Family History Family History  Problem Relation Age of Onset  . Stroke Father   . Heart disease Father   . Diabetes Father     Social History Social History   Tobacco Use  . Smoking status: Current Every Day Smoker    Packs/day: 0.25  Substance Use Topics  . Alcohol use: No  . Drug use: Not on file     Allergies   Penicillins   Review of Systems Review of Systems  Gastrointestinal: Positive for abdominal pain and hematochezia.  All other systems reviewed and are negative.    Physical Exam Updated Vital Signs BP (!) 150/99 (BP Location: Left Arm)   Pulse 75   Temp 98 F (36.7 C) (Oral)   Resp 16   Ht 6' (1.829 m)   Wt 72.6 kg (160 lb)   SpO2 100%   BMI 21.70 kg/m   Physical Exam  Constitutional: He is oriented to person, place, and time. He appears well-developed and well-nourished.  HENT:  Head: Normocephalic.  Eyes: EOM are normal.  Neck: Normal range of motion.  Cardiovascular: Normal rate.  Pulmonary/Chest: Effort normal.  Abdominal: Normal appearance. He exhibits no  distension.  Genitourinary:  Genitourinary Comments: 2cm hemorrhoid , small amount of bleeding .    Musculoskeletal: Normal range of motion.  Neurological: He is alert and oriented to person, place, and time.  Skin: Skin is warm.  Psychiatric: He has a normal mood and affect.  Nursing note and vitals reviewed.    ED Treatments / Results  Labs (all labs ordered are listed, but only abnormal results are displayed) Labs Reviewed  CBC WITH DIFFERENTIAL/PLATELET - Abnormal; Notable for the following components:      Result Value   HCT 38.8 (*)    All other components within normal limits  BASIC METABOLIC PANEL     EKG  EKG Interpretation None       Radiology No results found.  Procedures Procedures (including critical care time)  Medications Ordered in ED Medications - No data to display   Initial Impression / Assessment and Plan / ED Course  I have reviewed the triage vital signs and the nursing notes.  Pertinent labs & imaging results that were available during my care of the patient were reviewed by me and considered in my medical decision making (see chart for details).     Pt counseled on hemorrhoids.  Pt advised I will given him anusol and colace.   Final Clinical Impressions(s) / ED Diagnoses   Final diagnoses:  Hemorrhoids, unspecified hemorrhoid type    ED Discharge Orders        Ordered    docusate sodium (COLACE) 100 MG capsule  Every 12 hours     06/23/17 1400    hydrocortisone (ANUSOL-HC) 2.5 % rectal cream     06/23/17 1400    An After Visit Summary was printed and given to the patient.   Elson AreasSofia, Leslie K, [place] JerseyPA-C 06/23/17 1401    Lorre NickAllen, Anthony, MD 06/23/17 [id number]

## 2017-06-23 NOTE — ED Notes (Signed)
Bed: WA01 Expected date:  Expected time:  Means of arrival:  Comments: 

## 2018-07-01 ENCOUNTER — Emergency Department (HOSPITAL_COMMUNITY)
Admission: EM | Admit: 2018-07-01 | Discharge: 2018-07-01 | Disposition: A | Discharge status: Home / Self Care | Payer: PRIVATE HEALTH INSURANCE | Attending: Emergency Medicine | Admitting: Emergency Medicine

## 2018-07-01 ENCOUNTER — Encounter (HOSPITAL_COMMUNITY): Payer: Self-pay

## 2018-07-01 ENCOUNTER — Other Ambulatory Visit: Payer: Self-pay

## 2018-07-01 DIAGNOSIS — F17200 Nicotine dependence, unspecified, uncomplicated: Secondary | ICD-10-CM | POA: Insufficient documentation

## 2018-07-01 DIAGNOSIS — J029 Acute pharyngitis, unspecified: Secondary | ICD-10-CM | POA: Insufficient documentation

## 2018-07-01 DIAGNOSIS — Z79899 Other long term (current) drug therapy: Secondary | ICD-10-CM | POA: Insufficient documentation

## 2018-07-01 LAB — GROUP A STREP BY PCR: Group A Strep by PCR: NOT DETECTED

## 2018-07-01 MED ORDER — IBUPROFEN 600 MG PO TABS: 600.0000 mg | ORAL_TABLET | Freq: Four times a day (QID) | ORAL | 0 refills | Status: AC | PRN

## 2018-07-01 MED ORDER — ACETAMINOPHEN 500 MG PO TABS: 500.0000 mg | ORAL_TABLET | Freq: Four times a day (QID) | ORAL | 0 refills | Status: AC | PRN

## 2018-07-01 MED ORDER — CETIRIZINE-PSEUDOEPHEDRINE ER 5-120 MG PO TB12: 1.0000 | ORAL_TABLET | Freq: Every day | ORAL | 0 refills | Status: AC

## 2018-07-01 MED ORDER — SALINE SPRAY 0.65 % NA SOLN: 1.0000 | NASAL | 0 refills | Status: AC | PRN

## 2018-07-01 NOTE — ED Provider Notes (Signed)
[facility]-EMERGENCY DEPT Provider Note   CSN: [id number] Arrival date & time: 07/01/18  1100     History   Chief Complaint Chief Complaint  Patient presents with  . Sore Throat    HPI Kenneth Garrison is a 28 y.o. male who presents with a 3-day history of sore throat.  He reports he was sick with probably the flu last week and got better and now he has continued nasal congestion and sore throat.  He denies any fevers.  He has been taking NyQuil at home to help him sleep.  He denies any chest pain, shortness of breath, continued cough, abdominal pain, nausea, vomiting, or ear pain.  HPI  Past Medical History:  Diagnosis Date  . Heart murmur   . Migraines     There are no active problems to display for this patient.   History reviewed. No pertinent surgical history.      Home Medications    Prior to Admission medications   Medication Sig Start Date End Date Taking? Authorizing Provider  acetaminophen (TYLENOL) 500 MG tablet Take 1 tablet (500 mg total) by mouth every 6 (six) hours as needed. 07/01/18   Josanna Hefel, Waylan BogaAlexandra M, PA-C  cetirizine-pseudoephedrine (ZYRTEC-D) 5-120 MG tablet Take 1 tablet by mouth daily. 07/01/18   Jordynne Mccown, Waylan BogaAlexandra M, PA-C  clonazePAM (KLONOPIN) 0.5 MG tablet Take 0.5 mg by mouth once.    [provider]  docusate sodium (COLACE) 100 MG capsule Take 1 capsule (100 mg total) by mouth every 12 (twelve) hours. 06/23/17   Elson AreasSofia, Leslie K, PA-C  hydrocortisone (ANUSOL-HC) 2.5 % rectal cream Apply rectally 2 times daily 06/23/17   Elson AreasSofia, Leslie K, PA-C  ibuprofen (ADVIL,MOTRIN) 600 MG tablet Take 1 tablet (600 mg total) by mouth every 6 (six) hours as needed. 07/01/18   Emi HolesLaw, Kemond Amorin M, PA-C  Multiple Vitamin (MULTIVITAMIN WITH MINERALS) TABS tablet Take 1 tablet by mouth daily.    [provider]  sodium chloride (OCEAN) 0.65 % SOLN nasal spray Place 1 spray into both nostrils as needed for congestion. 07/01/18   Emi HolesLaw, Shulamit Donofrio M,  PA-C    Family History Family History  Problem Relation Age of Onset  . Stroke Father   . Heart disease Father   . Diabetes Father     Social History Social History   Tobacco Use  . Smoking status: Current Every Day Smoker    Packs/day: 0.25  . Smokeless tobacco: Never Used  Substance Use Topics  . Alcohol use: No  . Drug use: Never     Allergies   Penicillins   Review of Systems Review of Systems  Constitutional: Negative for fever.  HENT: Positive for congestion and sore throat.   Respiratory: Negative for cough and shortness of breath.   Cardiovascular: Negative for chest pain.  Gastrointestinal: Negative for abdominal pain, nausea and vomiting.     Physical Exam Updated Vital Signs BP (!) 149/108 (BP Location: Right Arm)   Pulse 64   Temp 98.6 F (37 C) (Oral)   Resp 16   Ht 6\' 1"  (1.854 m)   Wt 68 kg   SpO2 100%   BMI 19.79 kg/m   Physical Exam Vitals signs and nursing note reviewed.  Constitutional:      General: He is not in acute distress.    Appearance: He is well-developed. He is not diaphoretic.  HENT:     Head: Normocephalic and atraumatic.     Right Ear: Tympanic membrane normal.  Left Ear: Tympanic membrane normal.     Mouth/Throat:     Pharynx: Posterior oropharyngeal erythema present. No oropharyngeal exudate.     Tonsils: No tonsillar exudate or tonsillar abscesses. Swelling: 1+ on the right. 1+ on the left.  Eyes:     General: No scleral icterus.       Right eye: No discharge.        Left eye: No discharge.     Conjunctiva/sclera: Conjunctivae normal.     Pupils: Pupils are equal, round, and reactive to light.  Neck:     Musculoskeletal: Normal range of motion and neck supple.     Thyroid: No thyromegaly.  Cardiovascular:     Rate and Rhythm: Normal rate and regular rhythm.     Heart sounds: Normal heart sounds. No murmur. No friction rub. No gallop.   Pulmonary:     Effort: Pulmonary effort is normal. No respiratory  distress.     Breath sounds: Normal breath sounds. No stridor. No wheezing or rales.  Abdominal:     General: Bowel sounds are normal. There is no distension.     Palpations: Abdomen is soft.     Tenderness: There is no abdominal tenderness. There is no guarding or rebound.  Lymphadenopathy:     Cervical: No cervical adenopathy.  Skin:    General: Skin is warm and dry.     Coloration: Skin is not pale.     Findings: No rash.  Neurological:     Mental Status: He is alert.     Coordination: Coordination normal.      ED Treatments / Results  Labs (all labs ordered are listed, but only abnormal results are displayed) Labs Reviewed  GROUP A STREP BY PCR    EKG None  Radiology No results found.  Procedures Procedures (including critical care time)  Medications Ordered in ED Medications - No data to display   Initial Impression / Assessment and Plan / ED Course  I have reviewed the triage vital signs and the nursing notes.  Pertinent labs & imaging results that were available during my care of the patient were reviewed by me and considered in my medical decision making (see chart for details).     Patient presenting with probable viral pharyngitis and/or related to postnasal drip.  Strep is negative.  No signs of peritonsillar abscess or other deep space infection.  Patient is very well-appearing with stable vitals.  Patient will be given supportive treatment including ibuprofen, Tylenol, Zyrtec-D, nasal saline.  Patient advised salt water gargles and hot tea.  Return precautions discussed.  Patient understands and agrees with plan.  Patient vital stable throughout ED course and discharged in satisfactory condition.  Final Clinical Impressions(s) / ED Diagnoses   Final diagnoses:  Viral pharyngitis    ED Discharge Orders         Ordered    sodium chloride (OCEAN) 0.65 % SOLN nasal spray  As needed     07/01/18 1421    cetirizine-pseudoephedrine (ZYRTEC-D) 5-120 MG  tablet  Daily     07/01/18 1421    ibuprofen (ADVIL,MOTRIN) 600 MG tablet  Every 6 hours PRN     07/01/18 1421    acetaminophen (TYLENOL) 500 MG tablet  Every 6 hours PRN     07/01/18 [address], PA-C 07/01/18 2001    Raeford Razor, MD 07/02/18 [phone]

## 2018-07-01 NOTE — Discharge Instructions (Addendum)
Take Zyrtec-D in the morning for nasal congestion.  Use nasal saline as needed throughout the day for nasal congestion.  Alternate ibuprofen and Tylenol as prescribed, as needed for your throat pain.  You can take NyQuil at night, but make sure not to take more than 4000 mg of Tylenol in a 24-hour period.  NyQuil has Tylenol in it as well.  You can also gargle with warm salt water to help soothe your throat.  Drink tea with honey and lemon.  Make sure to drink plenty of fluids and stay hydrated.  Please return to the emergency department if you develop any new or worsening symptoms.

## 2018-07-01 NOTE — ED Triage Notes (Signed)
Pt states that he has had a sore throat x 1 week. Pt states that he had been sick the week prior to that as well "probably the flu". Pt states it is painful to swallow. Pt able to swallow secretions and speak in full sentences.

## 2018-09-03 ENCOUNTER — Emergency Department (HOSPITAL_COMMUNITY)
Admission: EM | Admit: 2018-09-03 | Discharge: 2018-09-04 | Disposition: A | Discharge status: Home / Self Care | Payer: PRIVATE HEALTH INSURANCE | Attending: Emergency Medicine | Admitting: Emergency Medicine

## 2018-09-03 ENCOUNTER — Other Ambulatory Visit: Payer: Self-pay

## 2018-09-03 DIAGNOSIS — R112 Nausea with vomiting, unspecified: Secondary | ICD-10-CM | POA: Insufficient documentation

## 2018-09-03 DIAGNOSIS — F17200 Nicotine dependence, unspecified, uncomplicated: Secondary | ICD-10-CM | POA: Insufficient documentation

## 2018-09-03 DIAGNOSIS — R197 Diarrhea, unspecified: Secondary | ICD-10-CM | POA: Insufficient documentation

## 2018-09-03 DIAGNOSIS — Z79899 Other long term (current) drug therapy: Secondary | ICD-10-CM | POA: Insufficient documentation

## 2018-09-03 MED ORDER — ONDANSETRON 4 MG PO TBDP: 4.0000 mg | ORAL_TABLET | Freq: Three times a day (TID) | ORAL | 0 refills | Status: AC | PRN

## 2018-09-03 MED ORDER — LOPERAMIDE HCL 2 MG PO CAPS: 2.0000 mg | ORAL_CAPSULE | Freq: Four times a day (QID) | ORAL | 0 refills | Status: AC | PRN

## 2018-09-03 MED ORDER — SODIUM CHLORIDE 0.9% FLUSH: 3.0000 mL | Freq: Once | INTRAVENOUS | Status: DC

## 2018-09-03 NOTE — Discharge Instructions (Signed)
You can take these medication to help with symptoms. Follow BRAT diet. Can follow-up at the wellness clinic or patient care center-- call for appt.

## 2018-09-03 NOTE — ED Triage Notes (Addendum)
Pt c/o nausea for the past couple hours. Diarrhea 2 days ago. Endorse no other symptoms.

## 2018-09-03 NOTE — ED Notes (Signed)
Paged [facility] doc-Berry to Dr Eudelia Bunch

## 2018-09-03 NOTE — ED Provider Notes (Signed)
[facility]   CSN: [id number] Arrival date & time: 09/03/18  2326    History   Chief Complaint Chief Complaint  Patient presents with   Nausea    HPI Kenneth Garrison is a 28 y.o. male.   The history is provided by the patient and medical records.    28 y.o. M with history of migraines, presenting to the ED with nausea, vomiting, and diarrhea that began today.  States he works second shift and left work around 6 PM as he was "not feeling well".  Around 7:30PM he started drinking liquor, has had several drinks of [place] thus far.  He states he has had several episodes of non-bloody diarrhea since then.  Ate pork chops yesterday that "didn't sit right".  He took some OTC motion sickness pills at home which helped with the nausea, has been able to tolerate fluids since then.  States he came in "just to make sure i'm ok but I don't think I need any tests".    Past Medical History:  Diagnosis Date   Heart murmur    Migraines     There are no active problems to display for this patient.   No past surgical history on file.      Home Medications    Prior to Admission medications   Medication Sig Start Date End Date Taking? Authorizing Provider  acetaminophen (TYLENOL) 500 MG tablet Take 1 tablet (500 mg total) by mouth every 6 (six) hours as needed. 07/01/18   Law, Waylan BogaAlexandra M, PA-C  cetirizine-pseudoephedrine (ZYRTEC-D) 5-120 MG tablet Take 1 tablet by mouth daily. 07/01/18   Law, Waylan BogaAlexandra M, PA-C  clonazePAM (KLONOPIN) 0.5 MG tablet Take 0.5 mg by mouth once.    [provider]  docusate sodium (COLACE) 100 MG capsule Take 1 capsule (100 mg total) by mouth every 12 (twelve) hours. 06/23/17   Elson AreasSofia, Leslie K, PA-C  hydrocortisone (ANUSOL-HC) 2.5 % rectal cream Apply rectally 2 times daily 06/23/17   Elson AreasSofia, Leslie K, PA-C  ibuprofen (ADVIL,MOTRIN) 600 MG tablet Take 1 tablet (600 mg total) by mouth every 6 (six)  hours as needed. 07/01/18   Emi HolesLaw, Alexandra M, PA-C  Multiple Vitamin (MULTIVITAMIN WITH MINERALS) TABS tablet Take 1 tablet by mouth daily.    [provider]  sodium chloride (OCEAN) 0.65 % SOLN nasal spray Place 1 spray into both nostrils as needed for congestion. 07/01/18   Emi HolesLaw, Alexandra M, PA-C    Family History Family History  Problem Relation Age of Onset   Stroke Father    Heart disease Father    Diabetes Father     Social History Social History   Tobacco Use   Smoking status: Current Every Day Smoker    Packs/day: 0.25   Smokeless tobacco: Never Used  Substance Use Topics   Alcohol use: No   Drug use: Never     Allergies   Penicillins   Review of Systems Review of Systems  Gastrointestinal: Positive for diarrhea, nausea and vomiting.  All other systems reviewed and are negative.    Physical Exam Updated Vital Signs BP (!) 170/102 (BP Location: Right Arm)    Pulse (!) 116    Temp 99.1 F (37.3 C) (Oral)    SpO2 98%   Physical Exam Vitals signs and nursing note reviewed.  Constitutional:      Appearance: He is well-developed.     Comments: Sitting up in bed, NAD  HENT:  Head: Normocephalic and atraumatic.  Eyes:     Conjunctiva/sclera: Conjunctivae normal.     Pupils: Pupils are equal, round, and reactive to light.  Neck:     Musculoskeletal: Normal range of motion.  Cardiovascular:     Rate and Rhythm: Normal rate and regular rhythm.     Heart sounds: Normal heart sounds.  Pulmonary:     Effort: Pulmonary effort is normal.     Breath sounds: Normal breath sounds.  Abdominal:     General: Bowel sounds are normal.     Palpations: Abdomen is soft.     Tenderness: There is no abdominal tenderness.     Comments: Soft, non-tender  Musculoskeletal: Normal range of motion.  Skin:    General: Skin is warm and dry.  Neurological:     Mental Status: He is alert and oriented to person, place, and time.      ED Treatments / Results    Labs (all labs ordered are listed, but only abnormal results are displayed) Labs Reviewed - No data to display  EKG None  Radiology No results found.  Procedures Procedures (including critical care time)  Medications Ordered in ED Medications - No data to display   Initial Impression / Assessment and Plan / ED Course  I have reviewed the triage vital signs and the nursing notes.  Pertinent labs & imaging results that were available during my care of the patient were reviewed by me and considered in my medical decision making (see chart for details).  28 year old male here with nausea, vomiting and diarrhea.  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  He admits to eating pork chops yesterday that he states "did not sit right".  He is afebrile and nontoxic in appearance here.  Abdomen is soft and benign.  Mucous membranes are moist, does not appear clinically dehydrated.  Patient states "I just wanted to make sure I am okay, I do not think I need a lot of test".  Discussed with him the symptoms may be food related versus viral etiology, however drinking alcohol is certainly not going to help his symptoms.  I do not feel he needs work-up emergently.  He was given prescriptions for Zofran and Imodium to take as needed, encourage brat diet.  Not currently have PCP or insurance so was given information for patient care center in the wellness clinic for follow-up.  He can return here for any new or acute changes.  Final Clinical Impressions(s) / ED Diagnoses   Final diagnoses:  Nausea vomiting and diarrhea    ED Discharge Orders         Ordered    loperamide (IMODIUM) 2 MG capsule  4 times daily PRN     09/03/18 2352    ondansetron (ZOFRAN ODT) 4 MG disintegrating tablet  Every 8 hours PRN     09/03/18 2352           Garlon Hatchet, PA-C 09/04/18 0002    Gilda Crease, MD 09/05/18 0532

## 2018-09-04 NOTE — ED Notes (Signed)
Called [facility] to activate code [facility] @ 12:00 am

## 2019-11-17 ENCOUNTER — Encounter (HOSPITAL_COMMUNITY): Payer: Self-pay | Admitting: Emergency Medicine

## 2019-11-17 ENCOUNTER — Emergency Department (HOSPITAL_COMMUNITY)
Admission: EM | Admit: 2019-11-17 | Discharge: 2019-11-17 | Disposition: A | Discharge status: Home / Self Care | Payer: 59 | Attending: Emergency Medicine | Admitting: Emergency Medicine

## 2019-11-17 ENCOUNTER — Other Ambulatory Visit: Payer: Self-pay

## 2019-11-17 DIAGNOSIS — Z5321 Procedure and treatment not carried out due to patient leaving prior to being seen by health care provider: Secondary | ICD-10-CM | POA: Insufficient documentation

## 2019-11-17 DIAGNOSIS — J029 Acute pharyngitis, unspecified: Secondary | ICD-10-CM | POA: Insufficient documentation

## 2019-11-17 DIAGNOSIS — R040 Epistaxis: Secondary | ICD-10-CM | POA: Diagnosis not present

## 2019-11-17 NOTE — ED Triage Notes (Signed)
Patient arrives to ED with complaints of sore throat and swollen tonsils x2days. Patient states that his tonsils swell up every couple of months.

## 2019-11-18 ENCOUNTER — Emergency Department (HOSPITAL_COMMUNITY)
Admission: EM | Admit: 2019-11-18 | Discharge: 2019-11-18 | Disposition: A | Discharge status: Home / Self Care | Payer: 59 | Attending: Emergency Medicine | Admitting: Emergency Medicine

## 2019-11-18 ENCOUNTER — Encounter (HOSPITAL_COMMUNITY): Payer: Self-pay | Admitting: Emergency Medicine

## 2019-11-18 ENCOUNTER — Other Ambulatory Visit: Payer: Self-pay

## 2019-11-18 DIAGNOSIS — F17200 Nicotine dependence, unspecified, uncomplicated: Secondary | ICD-10-CM | POA: Insufficient documentation

## 2019-11-18 DIAGNOSIS — J039 Acute tonsillitis, unspecified: Secondary | ICD-10-CM

## 2019-11-18 DIAGNOSIS — J029 Acute pharyngitis, unspecified: Secondary | ICD-10-CM | POA: Insufficient documentation

## 2019-11-18 LAB — GROUP A STREP BY PCR: Group A Strep by PCR: NOT DETECTED

## 2019-11-18 MED ORDER — KETOROLAC TROMETHAMINE 15 MG/ML IJ SOLN
15.0000 mg | Freq: Once | INTRAMUSCULAR | Status: AC
  Administered 2019-11-18: 15 mg via INTRAMUSCULAR
  Filled 2019-11-18: qty 1

## 2019-11-18 MED ORDER — NAPROXEN 500 MG PO TABS: 500.0000 mg | ORAL_TABLET | Freq: Two times a day (BID) | ORAL | 0 refills | Status: DC

## 2019-11-18 MED ORDER — LIDOCAINE VISCOUS HCL 2 % MT SOLN: 15.0000 mL | OROMUCOSAL | 0 refills | Status: AC | PRN

## 2019-11-18 MED ORDER — DEXAMETHASONE SODIUM PHOSPHATE 10 MG/ML IJ SOLN
10.0000 mg | Freq: Once | INTRAMUSCULAR | Status: AC
  Administered 2019-11-18: 10 mg via INTRAMUSCULAR
  Filled 2019-11-18: qty 1

## 2019-11-18 MED ORDER — CLINDAMYCIN HCL 300 MG PO CAPS
300.0000 mg | ORAL_CAPSULE | Freq: Once | ORAL | Status: AC
  Administered 2019-11-18: 300 mg via ORAL
  Filled 2019-11-18: qty 1

## 2019-11-18 MED ORDER — LIDOCAINE VISCOUS HCL 2 % MT SOLN
15.0000 mL | Freq: Once | OROMUCOSAL | Status: AC
  Administered 2019-11-18: 15 mL via OROMUCOSAL
  Filled 2019-11-18: qty 15

## 2019-11-18 MED ORDER — CLINDAMYCIN HCL 300 MG PO CAPS: 300.0000 mg | ORAL_CAPSULE | Freq: Three times a day (TID) | ORAL | 0 refills | Status: AC

## 2019-11-18 NOTE — ED Notes (Signed)
Patient tolerated fluids w/o pain or difficulty.

## 2019-11-18 NOTE — ED Triage Notes (Signed)
Pt c/o sore throat and swollen tonsils for 3 day. Reports causing him hard to breathe while at work.

## 2019-11-18 NOTE — ED Provider Notes (Signed)
[facility] DEPT Provider Note   CSN: [id number] Arrival date & time: 11/18/19  5053    History Chief Complaint  Patient presents with  . Sore Throat   Kenneth Garrison is a 29 y.o. male with past medical history significant for migraines who presents for evaluation of sore throat.  Patient states his bilateral tonsils and swelling x3 days.  Patient states it is very painful to swallow.  He is not taking anything for this.  States he has had chills at home however is not measured his temperature.  No drooling, dysphagia or trismus.  Denies any facial swelling.  No headache, lightheadedness, dizziness, neck pain, neck stiffness, chest pain, shortness of breath, cough, hemoptysis, emesis, abdominal pain.  Rates his pain a 9/10.  Denies additional aggravating or relieving factors.  Patient does state that he gets recurrent tonsillitis approximately 3 times yearly.  Patient states "I am over this, I want them out."  History obtained from patient and past medical records.  No interpreter is used.  HPI     Past Medical History:  Diagnosis Date  . Heart murmur   . Migraines     There are no problems to display for this patient.   History reviewed. No pertinent surgical history.     Family History  Problem Relation Age of Onset  . Stroke Father   . Heart disease Father   . Diabetes Father     Social History   Tobacco Use  . Smoking status: Current Every Day Smoker    Packs/day: 0.25  . Smokeless tobacco: Never Used  Substance Use Topics  . Alcohol use: No  . Drug use: Never    Home Medications Prior to Admission medications   Medication Sig Start Date End Date Taking? Authorizing Provider  acetaminophen (TYLENOL) 500 MG tablet Take 1 tablet (500 mg total) by mouth every 6 (six) hours as needed. 07/01/18  Yes Law, Alexandra M, PA-C  ALPRAZolam Duanne Moron) 0.25 MG tablet Take 0.25 mg by mouth once. Pt got 2 0.25mg  tablets in order to help him sleep  due to his illness as this time.   Yes [provider]  cetirizine-pseudoephedrine (ZYRTEC-D) 5-120 MG tablet Take 1 tablet by mouth daily. Patient not taking: Reported on 11/18/2019 07/01/18   Frederica Kuster, PA-C  clindamycin (CLEOCIN) 300 MG capsule Take 1 capsule (300 mg total) by mouth 3 (three) times daily for 7 days. 11/18/19 11/25/19  Rionna Feltes A, PA-C  docusate sodium (COLACE) 100 MG capsule Take 1 capsule (100 mg total) by mouth every 12 (twelve) hours. Patient not taking: Reported on 11/18/2019 06/23/17   Fransico Meadow, PA-C  hydrocortisone (ANUSOL-HC) 2.5 % rectal cream Apply rectally 2 times daily Patient not taking: Reported on 11/18/2019 06/23/17   Fransico Meadow, PA-C  ibuprofen (ADVIL,MOTRIN) 600 MG tablet Take 1 tablet (600 mg total) by mouth every 6 (six) hours as needed. Patient not taking: Reported on 11/18/2019 07/01/18   Frederica Kuster, PA-C  lidocaine (XYLOCAINE) 2 % solution Use as directed 15 mLs in the mouth or throat as needed for mouth pain. 11/18/19   Wash Nienhaus A, PA-C  loperamide (IMODIUM) 2 MG capsule Take 1 capsule (2 mg total) by mouth 4 (four) times daily as needed for diarrhea or loose stools. Patient not taking: Reported on 11/18/2019 09/03/18   Larene Pickett, PA-C  naproxen (NAPROSYN) 500 MG tablet Take 1 tablet (500 mg total) by mouth 2 (two) times daily. 11/18/19  Janesia Joswick A, PA-C  ondansetron (ZOFRAN ODT) 4 MG disintegrating tablet Take 1 tablet (4 mg total) by mouth every 8 (eight) hours as needed for nausea. Patient not taking: Reported on 11/18/2019 09/03/18   Garlon Hatchet, PA-C  sodium chloride (OCEAN) 0.65 % SOLN nasal spray Place 1 spray into both nostrils as needed for congestion. Patient not taking: Reported on 11/18/2019 07/01/18   Emi Holes, PA-C    Allergies    Penicillins  Review of Systems   Review of Systems  Constitutional: Negative.   HENT: Positive for sore throat. Negative for congestion, drooling, ear  discharge, facial swelling, nosebleeds, postnasal drip, rhinorrhea, sinus pressure, sinus pain, sneezing, trouble swallowing and voice change.   Respiratory: Negative.   Cardiovascular: Negative.   Gastrointestinal: Negative.   Genitourinary: Negative.   Musculoskeletal: Negative.   Skin: Negative.   Neurological: Negative.   All other systems reviewed and are negative.   Physical Exam Updated Vital Signs BP (!) 160/103 (BP Location: Right Arm)   Pulse 71   Temp 98.1 F (36.7 C) (Oral)   Resp 15   SpO2 100%   Physical Exam Vitals and nursing note reviewed.  Constitutional:      General: He is not in acute distress.    Appearance: He is well-developed. He is not ill-appearing, toxic-appearing or diaphoretic.  HENT:     Head: Normocephalic and atraumatic.     Jaw: There is normal jaw occlusion.     Comments: No drooling, dysphagia or trismus.  No submandibular edema, induration.    Nose: Nose normal. No congestion or rhinorrhea.     Right Sinus: No maxillary sinus tenderness or frontal sinus tenderness.     Left Sinus: No maxillary sinus tenderness or frontal sinus tenderness.     Mouth/Throat:     Lips: Pink.     Mouth: Mucous membranes are moist.     Tongue: No lesions.     Palate: No mass and lesions.     Pharynx: Uvula midline. Posterior oropharyngeal erythema present. No pharyngeal swelling, oropharyngeal exudate or uvula swelling.     Tonsils: Tonsillar exudate present. No tonsillar abscesses. 3+ on the right. 3+ on the left.     Comments: Sublingual area soft.  Dentition intact.  Tongue midline.  Patient with 3+ tonsils bilaterally however did not meet at midline.  There are exudates bilaterally.  Slight increase to right tonsil however uvula midline.  No gross PTA or RPA.  No pooling of secretions Eyes:     Pupils: Pupils are equal, round, and reactive to light.  Neck:     Trachea: Trachea and phonation normal.     Comments: No neck stiffness or neck  rigidity. Cardiovascular:     Rate and Rhythm: Normal rate and regular rhythm.     Pulses: Normal pulses.          Radial pulses are 2+ on the right side and 2+ on the left side.     Heart sounds: Normal heart sounds.  Pulmonary:     Effort: Pulmonary effort is normal. No respiratory distress.     Breath sounds: Normal breath sounds and air entry.     Comments: Clear to auscultation bilaterally at wheeze, rhonchi or rales speaks in full sentences of difficulty.  No adventitious breath sounds Abdominal:     General: Bowel sounds are normal. There is no distension.     Palpations: Abdomen is soft.     Tenderness: There is no abdominal  tenderness.     Comments: Soft, nontender  Musculoskeletal:        General: Normal range of motion.     Cervical back: Full passive range of motion without pain, normal range of motion and neck supple.     Comments: Moves all 4 extremities without difficulty.  No bony tenderness.  Compartments soft.  Denna Haggard' sign negative  Skin:    General: Skin is warm and dry.     Capillary Refill: Capillary refill takes less than 2 seconds.     Comments: No edema, erythema or warmth.  No fluctuance or induration.  No rashes or lesions  Neurological:     General: No focal deficit present.     Mental Status: He is alert.     Cranial Nerves: Cranial nerves are intact.     Comments: CN 2-12 grossly intact.     ED Results / Procedures / Treatments   Labs (all labs ordered are listed, but only abnormal results are displayed) Labs Reviewed  GROUP A STREP BY PCR    EKG None  Radiology No results found.  Procedures Procedures (including critical care time)  Medications Ordered in ED Medications  lidocaine (XYLOCAINE) 2 % viscous mouth solution 15 mL (15 mLs Mouth/Throat Given 11/18/19 0942)  ketorolac (TORADOL) 15 MG/ML injection 15 mg (15 mg Intramuscular Given 11/18/19 0946)  dexamethasone (DECADRON) injection 10 mg (10 mg Intramuscular Given 11/18/19 0943)   clindamycin (CLEOCIN) capsule 300 mg (300 mg Oral Given 11/18/19 1001)    ED Course  I have reviewed the triage vital signs and the nursing notes.  Pertinent labs & imaging results that were available during my care of the patient were reviewed by me and considered in my medical decision making (see chart for details).  29 year old presents for evaluation of sore throat x3 days.  He is afebrile, nonseptic, non-ill-appearing.  3+ tonsils bilaterally however do not meet in the center.  Uvula midline.  There is slight increase in right tonsil however no gross PTA or RPA.  No drooling, dysphagia or trismus.  No stridor, clear lung sounds.  Low suspicion for deep space infection or Ludwig's angina.  Patient tolerating p.o. intake without difficulty.  Question tonsillitis versus early, small peritonsillar abscess given slight increase in right tonsil.  Strep test negative here in ED.  Will treat with antibiotics, symptomatic management at home.  Do not feel patient needs imaging/labs at this time as he appears over all well.  The patient has been appropriately medically screened and/or stabilized in the ED. I have low suspicion for any other emergent medical condition which would require further screening, evaluation or treatment in the ED or require inpatient management.  Patient is hemodynamically stable and in no acute distress.  Patient able to ambulate in department prior to ED.  Evaluation does not show acute pathology that would require ongoing or additional emergent interventions while in the emergency department or further inpatient treatment.  I have discussed the diagnosis with the patient and answered all questions.  Pain is been managed while in the emergency department and patient has no further complaints prior to discharge.  Patient is comfortable with plan discussed in room and is stable for discharge at this time.  I have discussed strict return precautions for returning to the emergency  department.  Patient was encouraged to follow-up with PCP/specialist refer to at discharge.   Patient states he has had recurrent tonsillitis.  We will treat symptomatically here given presentation and  will give him referral outpatient for ENT evaluation.    MDM Rules/Calculators/A&P                           Final Clinical Impression(s) / ED Diagnoses Final diagnoses:  Tonsillitis    Rx / DC Orders ED Discharge Orders         Ordered    lidocaine (XYLOCAINE) 2 % solution  As needed     Discontinue  Reprint     11/18/19 1041    clindamycin (CLEOCIN) 300 MG capsule  3 times daily     Discontinue  Reprint     11/18/19 1041    naproxen (NAPROSYN) 500 MG tablet  2 times daily     Discontinue  Reprint     11/18/19 1041           Karolyn Messing A, PA-C 11/18/19 1045    Little, Ambrose Finland, MD 11/18/19 1307

## 2019-11-18 NOTE — Discharge Instructions (Signed)
Take the antibiotics. Follow up out patient with the ENT doctor.    Return for new or worsening symptoms

## 2019-12-10 ENCOUNTER — Other Ambulatory Visit: Payer: Self-pay | Admitting: Physician Assistant

## 2019-12-10 DIAGNOSIS — N5089 Other specified disorders of the male genital organs: Secondary | ICD-10-CM

## 2020-04-11 ENCOUNTER — Ambulatory Visit (HOSPITAL_COMMUNITY)
Admission: EM | Admit: 2020-04-11 | Discharge: 2020-04-11 | Disposition: A | Discharge status: Home / Self Care | Payer: Self-pay

## 2020-04-11 ENCOUNTER — Other Ambulatory Visit: Payer: Self-pay

## 2020-04-11 NOTE — ED Notes (Signed)
Patient called once on outside and inside with no answer

## 2020-07-26 ENCOUNTER — Encounter (HOSPITAL_COMMUNITY): Payer: Self-pay

## 2020-07-26 ENCOUNTER — Emergency Department (HOSPITAL_COMMUNITY): Payer: Self-pay

## 2020-07-26 ENCOUNTER — Emergency Department (HOSPITAL_COMMUNITY)
Admission: EM | Admit: 2020-07-26 | Discharge: 2020-07-27 | Disposition: A | Discharge status: Home / Self Care | Payer: Self-pay | Attending: Emergency Medicine | Admitting: Emergency Medicine

## 2020-07-26 ENCOUNTER — Other Ambulatory Visit: Payer: Self-pay

## 2020-07-26 DIAGNOSIS — S61211A Laceration without foreign body of left index finger without damage to nail, initial encounter: Secondary | ICD-10-CM | POA: Insufficient documentation

## 2020-07-26 DIAGNOSIS — F17210 Nicotine dependence, cigarettes, uncomplicated: Secondary | ICD-10-CM | POA: Insufficient documentation

## 2020-07-26 DIAGNOSIS — W260XXA Contact with knife, initial encounter: Secondary | ICD-10-CM | POA: Insufficient documentation

## 2020-07-26 DIAGNOSIS — Y93G3 Activity, cooking and baking: Secondary | ICD-10-CM | POA: Insufficient documentation

## 2020-07-26 NOTE — ED Triage Notes (Signed)
Pt reports he was cooking and cut his L index finger, appx 2cm lac, moderate amount of bleeding, dressing applied in triage. Last tetanus a few months ago.

## 2020-07-27 ENCOUNTER — Other Ambulatory Visit: Payer: Self-pay

## 2020-07-27 MED ORDER — LIDOCAINE HCL (PF) 1 % IJ SOLN
5.0000 mL | Freq: Once | INTRAMUSCULAR | Status: DC
  Filled 2020-07-27: qty 5

## 2020-07-27 MED ORDER — OXYCODONE-ACETAMINOPHEN 5-325 MG PO TABS
1.0000 | ORAL_TABLET | Freq: Once | ORAL | Status: AC
  Administered 2020-07-27: 1 via ORAL
  Filled 2020-07-27: qty 1

## 2020-07-27 NOTE — ED Provider Notes (Signed)
[name] EMERGENCY DEPARTMENT Provider Note   CSN: [id number] Arrival date & time: 07/26/20  2314     History Chief Complaint  Patient presents with  . Laceration    [facility] Garrison is a 30 y.o. male.  The history is provided by the patient and medical records.  Laceration   30 y.o. M with hx of migraines here with left index finger laceration.  States he was cutting vegetables when knife slipped and cut his index finger.  Has laceration along dorsal aspect left index finger.  Bleeding controlled.  Tetanus UTD.  He is right hand dominant.  Past Medical History:  Diagnosis Date  . Heart murmur   . Migraines     There are no problems to display for this patient.   History reviewed. No pertinent surgical history.     Family History  Problem Relation Age of Onset  . Stroke Father   . Heart disease Father   . Diabetes Father     Social History   Tobacco Use  . Smoking status: Current Every Day Smoker    Packs/day: 0.25  . Smokeless tobacco: Never Used  Substance Use Topics  . Alcohol use: No  . Drug use: Never    Home Medications Prior to Admission medications   Medication Sig Start Date End Date Taking? Authorizing Provider  acetaminophen (TYLENOL) 500 MG tablet Take 1 tablet (500 mg total) by mouth every 6 (six) hours as needed. 07/01/18   Law, Waylan Boga, PA-C  ALPRAZolam Prudy Feeler) 0.25 MG tablet Take 0.25 mg by mouth once. Pt got 2 0.25mg  tablets in order to help him sleep due to his illness as this time.    [provider]  cetirizine-pseudoephedrine (ZYRTEC-D) 5-120 MG tablet Take 1 tablet by mouth daily. Patient not taking: Reported on 11/18/2019 07/01/18   Emi Holes, PA-C  docusate sodium (COLACE) 100 MG capsule Take 1 capsule (100 mg total) by mouth every 12 (twelve) hours. Patient not taking: Reported on 11/18/2019 06/23/17   Elson Areas, PA-C  hydrocortisone (ANUSOL-HC) 2.5 % rectal cream Apply rectally 2 times  daily Patient not taking: Reported on 11/18/2019 06/23/17   Elson Areas, PA-C  ibuprofen (ADVIL,MOTRIN) 600 MG tablet Take 1 tablet (600 mg total) by mouth every 6 (six) hours as needed. Patient not taking: Reported on 11/18/2019 07/01/18   Emi Holes, PA-C  lidocaine (XYLOCAINE) 2 % solution Use as directed 15 mLs in the mouth or throat as needed for mouth pain. 11/18/19   Henderly, Britni A, PA-C  loperamide (IMODIUM) 2 MG capsule Take 1 capsule (2 mg total) by mouth 4 (four) times daily as needed for diarrhea or loose stools. Patient not taking: Reported on 11/18/2019 09/03/18   Garlon Hatchet, PA-C  naproxen (NAPROSYN) 500 MG tablet Take 1 tablet (500 mg total) by mouth 2 (two) times daily. 11/18/19   Henderly, Britni A, PA-C  ondansetron (ZOFRAN ODT) 4 MG disintegrating tablet Take 1 tablet (4 mg total) by mouth every 8 (eight) hours as needed for nausea. Patient not taking: Reported on 11/18/2019 09/03/18   Garlon Hatchet, PA-C  sodium chloride (OCEAN) 0.65 % SOLN nasal spray Place 1 spray into both nostrils as needed for congestion. Patient not taking: Reported on 11/18/2019 07/01/18   Emi Holes, PA-C    Allergies    Penicillins  Review of Systems   Review of Systems  Skin: Positive for wound.  All other systems reviewed and are  negative.   Physical Exam Updated Vital Signs BP (!) 157/110 (BP Location: Right Arm)   Pulse 66   Temp 97.9 F (36.6 C) (Oral)   Resp 18   SpO2 99%   Physical Exam Vitals and nursing note reviewed.  Constitutional:      Appearance: He is well-developed and well-nourished.  HENT:     Head: Normocephalic and atraumatic.     Mouth/Throat:     Mouth: Oropharynx is clear and moist.  Eyes:     Extraocular Movements: EOM normal.     Conjunctiva/sclera: Conjunctivae normal.     Pupils: Pupils are equal, round, and reactive to light.  Cardiovascular:     Rate and Rhythm: Normal rate and regular rhythm.     Heart sounds: Normal heart sounds.   Pulmonary:     Effort: Pulmonary effort is normal.     Breath sounds: Normal breath sounds.  Abdominal:     General: Bowel sounds are normal.     Palpations: Abdomen is soft.  Musculoskeletal:        General: Normal range of motion.     Cervical back: Normal range of motion.     Comments: Left index finger with 3cm laceration along proximal dorsal surface, does cross over the IP joint just slightly, able to flex/extend, no deep tissue, vessel, or tendon involvement noted; hematoma present and evacuated from wound  Skin:    General: Skin is warm and dry.  Neurological:     Mental Status: He is alert and oriented to person, place, and time.  Psychiatric:        Mood and Affect: Mood and affect normal.     ED Results / Procedures / Treatments   Labs (all labs ordered are listed, but only abnormal results are displayed) Labs Reviewed - No data to display  EKG None  Radiology DG Finger Index Left  Result Date: 07/26/2020 CLINICAL DATA:  Pt reports he was cooking and cut his L index finger, appx 2cm lac, moderate amount of bleeding, dressing applied in triage EXAM: LEFT INDEX FINGER 2+V COMPARISON:  None. FINDINGS: There is no evidence of fracture or dislocation. There is no evidence of arthropathy or other focal bone abnormality. Subcutaneus soft tissue edema. No laceration not well visualized. No retained radiopaque foreign body; however, slightly limited evaluation due to overlying bandage. IMPRESSION: 1. No acute displaced fracture or dislocation. 2. No retained radiopaque foreign body. Electronically Signed   By: Tish Frederickson M.D.   On: 07/26/2020 23:57    Procedures Procedures   LACERATION REPAIR Performed by: Garlon Hatchet Authorized by: Garlon Hatchet Consent: Verbal consent obtained. Risks and benefits: risks, benefits and alternatives were discussed Consent given by: patient Patient identity confirmed: provided demographic data Prepped and Draped in normal sterile  fashion Wound explored  Laceration Location: left dorsal index finger  Laceration Length: 3cm  No Foreign Bodies seen or palpated  Anesthesia: local infiltration  Local anesthetic: lidocaine 1% without epinephrine  Anesthetic total: 4 ml  Irrigation method: syringe Amount of cleaning: standard  Skin closure: 4-0 prolene  Number of sutures: 3  Technique: simple interrupted  Patient tolerance: Patient tolerated the procedure well with no immediate complications.   Medications Ordered in ED Medications  oxyCODONE-acetaminophen (PERCOCET/ROXICET) 5-325 MG per tablet 1 tablet (has no administration in time range)  lidocaine (PF) (XYLOCAINE) 1 % injection 5 mL (has no administration in time range)    ED Course  I have reviewed the triage vital  signs and the nursing notes.  Pertinent labs & imaging results that were available during my care of the patient were reviewed by me and considered in my medical decision making (see chart for details).    MDM Rules/Calculators/A&P  30 year old male presenting chopping vegetables with knife slipped and cut the dorsal aspect of left proximal index finger.  Bleeding controlled on arrival.  Does have clot present in the wound which was evacuated.  Wound was further examined without deep tissue, vessel, or tendon involvement.  He is able to flex and extend without difficulty.  Imaging is negative.  His tetanus is up-to-date.  Laceration repaired as above.  Discharge home with wound care instructions and follow-up with PCP or urgent care in the next 7 to 10 days for suture removal.  May return here for any new or acute changes.  Final Clinical Impression(s) / ED Diagnoses Final diagnoses:  Laceration of left index finger without foreign body without damage to nail, initial encounter    Rx / DC Orders ED Discharge Orders    None       Garlon Hatchet, PA-C 07/27/20 0606    Jacalyn Lefevre, MD 07/27/20 [phone]

## 2020-07-27 NOTE — ED Notes (Signed)
Sutured wound by PA Sanders covered with non adherent dressing and kerlix. Instructed pt to go to primary care to have sutures removed in 7-10days.

## 2020-07-27 NOTE — Discharge Instructions (Signed)
Keep sutures clean and dry. Follow-up with your primary care doctor to have these removed in about 7-10 days.  If you do not have one, you can have this done at urgent care. Return to the ED for new or worsening symptoms.

## 2020-12-30 ENCOUNTER — Emergency Department (HOSPITAL_BASED_OUTPATIENT_CLINIC_OR_DEPARTMENT_OTHER)
Admission: EM | Admit: 2020-12-30 | Discharge: 2020-12-30 | Disposition: A | Discharge status: Home / Self Care | Payer: Self-pay | Attending: Emergency Medicine | Admitting: Emergency Medicine

## 2020-12-30 ENCOUNTER — Emergency Department (HOSPITAL_BASED_OUTPATIENT_CLINIC_OR_DEPARTMENT_OTHER): Payer: Self-pay

## 2020-12-30 ENCOUNTER — Encounter (HOSPITAL_BASED_OUTPATIENT_CLINIC_OR_DEPARTMENT_OTHER): Payer: Self-pay

## 2020-12-30 ENCOUNTER — Other Ambulatory Visit: Payer: Self-pay

## 2020-12-30 DIAGNOSIS — F10929 Alcohol use, unspecified with intoxication, unspecified: Secondary | ICD-10-CM | POA: Insufficient documentation

## 2020-12-30 DIAGNOSIS — R0789 Other chest pain: Secondary | ICD-10-CM | POA: Insufficient documentation

## 2020-12-30 DIAGNOSIS — F17210 Nicotine dependence, cigarettes, uncomplicated: Secondary | ICD-10-CM | POA: Insufficient documentation

## 2020-12-30 DIAGNOSIS — R0602 Shortness of breath: Secondary | ICD-10-CM | POA: Insufficient documentation

## 2020-12-30 DIAGNOSIS — R202 Paresthesia of skin: Secondary | ICD-10-CM | POA: Insufficient documentation

## 2020-12-30 LAB — CBC WITH DIFFERENTIAL/PLATELET
Abs Immature Granulocytes: 0.01 10*3/uL (ref 0.00–0.07)
Basophils Absolute: 0.1 10*3/uL (ref 0.0–0.1)
Basophils Relative: 1 %
Eosinophils Absolute: 0 10*3/uL (ref 0.0–0.5)
Eosinophils Relative: 1 %
HCT: 36.6 % — ABNORMAL LOW (ref 39.0–52.0)
Hemoglobin: 12.1 g/dL — ABNORMAL LOW (ref 13.0–17.0)
Immature Granulocytes: 0 %
Lymphocytes Relative: 31 %
Lymphs Abs: 1.9 10*3/uL (ref 0.7–4.0)
MCH: 28.3 pg (ref 26.0–34.0)
MCHC: 33.1 g/dL (ref 30.0–36.0)
MCV: 85.7 fL (ref 80.0–100.0)
Monocytes Absolute: 0.7 10*3/uL (ref 0.1–1.0)
Monocytes Relative: 10 %
Neutro Abs: 3.5 10*3/uL (ref 1.7–7.7)
Neutrophils Relative %: 57 %
Platelets: 385 10*3/uL (ref 150–400)
RBC: 4.27 MIL/uL (ref 4.22–5.81)
RDW: 16.1 % — ABNORMAL HIGH (ref 11.5–15.5)
WBC: 6.2 10*3/uL (ref 4.0–10.5)
nRBC: 0 % (ref 0.0–0.2)

## 2020-12-30 LAB — COMPREHENSIVE METABOLIC PANEL
ALT: 16 U/L (ref 0–44)
AST: 16 U/L (ref 15–41)
Albumin: 4.3 g/dL (ref 3.5–5.0)
Alkaline Phosphatase: 67 U/L (ref 38–126)
Anion gap: 9 (ref 5–15)
BUN: 12 mg/dL (ref 6–20)
CO2: 25 mmol/L (ref 22–32)
Calcium: 8.9 mg/dL (ref 8.9–10.3)
Chloride: 104 mmol/L (ref 98–111)
Creatinine, Ser: 0.75 mg/dL (ref 0.61–1.24)
GFR, Estimated: >60 mL/min (ref >60)
Glucose, Bld: 88 mg/dL (ref 70–99)
Potassium: 3.9 mmol/L (ref 3.5–5.1)
Sodium: 138 mmol/L (ref 135–145)
Total Bilirubin: 0.5 mg/dL (ref 0.3–1.2)
Total Protein: 7.2 g/dL (ref 6.5–8.1)

## 2020-12-30 LAB — TROPONIN I (HIGH SENSITIVITY)
Troponin I (High Sensitivity): <2 ng/L (ref <18)
Troponin I (High Sensitivity): <2 ng/L (ref <18)

## 2020-12-30 MED ORDER — NAPROXEN 500 MG PO TABS: 500.0000 mg | ORAL_TABLET | Freq: Two times a day (BID) | ORAL | 0 refills | Status: AC

## 2020-12-30 MED ORDER — AMLODIPINE BESYLATE 5 MG PO TABS: 5.0000 mg | ORAL_TABLET | Freq: Every day | ORAL | 0 refills | Status: DC

## 2020-12-30 MED ORDER — KETOROLAC TROMETHAMINE 30 MG/ML IJ SOLN
30.0000 mg | Freq: Once | INTRAMUSCULAR | Status: AC
  Administered 2020-12-30: 30 mg via INTRAVENOUS
  Filled 2020-12-30: qty 1

## 2020-12-30 NOTE — Discharge Instructions (Addendum)
  You were evaluated in the Emergency Department and after careful evaluation, we did not find any emergent condition requiring admission or further testing in the hospital.   Your exam/testing today was overall reassuring.  Symptoms seem to be due to  chest wall pain, please see detailed structures.  You can take naproxen for the next 10 days for this.  You can also buy over-the-counter naproxen, you can buy the off brand which will be cheaper.  If you have any new or concerning symptom please come back to the emergency department.  I also prescribed you blood pressure medication, as we discussed.  Try to stretch prior to playing basketball. Please return to the Emergency Department if you experience any worsening of your condition.  Thank you for allowing [place] to be a part of your care. Please speak to your pharmacist about any new medications prescribed today in regards to side effects or interactions with other medications.    Contact a doctor if: You have a fever. Your chest pain gets worse. You have new symptoms. Get help right away if: You feel sick to your stomach (nauseous) or you throw up (vomit). You feel sweaty or light-headed. You have a cough with mucus from your lungs (sputum) or you cough up blood. You are short of breath.

## 2020-12-30 NOTE — ED Provider Notes (Signed)
[facility] EMERGENCY DEPARTMENT Provider Note   CSN: [id number] Arrival date & time: 12/30/20  1326     History Chief Complaint  Patient presents with   Chest Pain    Kenneth Garrison is a 30 y.o. male with pertinent past medical history of heart murmur that presents emergency room today for chest pain.  Patient states that he has been having intermittent chest pain for the past 4 months, has not been evaluated for this.  States that chest pain is sharp on the left side and will sometimes have some associated left arm numbness.  States that yesterday he had chest pain all day and states that he has been having intermittent chest pain this morning.  States that sometimes he will have some intermittent shortness of breath with this.  Is not worse with exertion, sometimes states that it happens at rest.  Denies any triggering factors, however states that it is worse after he plays basketball.  Patient states that he does play basketball frequently.  Denies any nausea, vomiting, regurgitation, cough, urinary symptoms.  Denies any calf pain or calf swelling, denies any history of anticoagulation disorder, recent long travel, recent surgeries.  Has never had DVT before.  Patient denies any fevers.  States that he does smoke tobacco and drink alcohol frequently.  Patient states that he stopped taking his hypertensive medications 3 months ago due to monetary reasons..  States that that chest pain is currently a 6 out of 10.  No relieving or alleviating factors, patient states that he tried Carafate from a friend yesterday which did not help.  HPI     Past Medical History:  Diagnosis Date   Heart murmur    Migraines     There are no problems to display for this patient.   History reviewed. No pertinent surgical history.     Family History  Problem Relation Age of Onset   Stroke Father    Heart disease Father    Diabetes Father     Social History   Tobacco Use   Smoking  status: Every Day    Packs/day: 0.25    Types: Cigarettes   Smokeless tobacco: Never  Substance Use Topics   Alcohol use: Yes   Drug use: Never    Home Medications Prior to Admission medications   Medication Sig Start Date End Date Taking? Authorizing Provider  amLODipine (NORVASC) 5 MG tablet Take 1 tablet (5 mg total) by mouth daily. 12/30/20 01/29/21 Yes Mliss Wedin, PA-C  naproxen (NAPROSYN) 500 MG tablet Take 1 tablet (500 mg total) by mouth 2 (two) times daily for 10 days. 12/30/20 01/09/21 Yes Satchel Heidinger, Donne Anon, PA-C  acetaminophen (TYLENOL) 500 MG tablet Take 1 tablet (500 mg total) by mouth every 6 (six) hours as needed. 07/01/18   Law, Waylan Boga, PA-C  ALPRAZolam Prudy Feeler) 0.25 MG tablet Take 0.25 mg by mouth once. Pt got 2 0.25mg  tablets in order to help him sleep due to his illness as this time.    [provider]  cetirizine-pseudoephedrine (ZYRTEC-D) 5-120 MG tablet Take 1 tablet by mouth daily. Patient not taking: Reported on 11/18/2019 07/01/18   Emi Holes, PA-C  docusate sodium (COLACE) 100 MG capsule Take 1 capsule (100 mg total) by mouth every 12 (twelve) hours. Patient not taking: Reported on 11/18/2019 06/23/17   Elson Areas, PA-C  hydrocortisone (ANUSOL-HC) 2.5 % rectal cream Apply rectally 2 times daily Patient not taking: Reported on 11/18/2019 06/23/17   Elson Areas,  PA-C  ibuprofen (ADVIL,MOTRIN) 600 MG tablet Take 1 tablet (600 mg total) by mouth every 6 (six) hours as needed. Patient not taking: Reported on 11/18/2019 07/01/18   Emi HolesLaw, Alexandra M, PA-C  lidocaine (XYLOCAINE) 2 % solution Use as directed 15 mLs in the mouth or throat as needed for mouth pain. 11/18/19   Henderly, Britni A, PA-C  loperamide (IMODIUM) 2 MG capsule Take 1 capsule (2 mg total) by mouth 4 (four) times daily as needed for diarrhea or loose stools. Patient not taking: Reported on 11/18/2019 09/03/18   Garlon HatchetSanders, Lisa M, PA-C  ondansetron (ZOFRAN ODT) 4 MG disintegrating tablet Take 1 tablet  (4 mg total) by mouth every 8 (eight) hours as needed for nausea. Patient not taking: Reported on 11/18/2019 09/03/18   Garlon HatchetSanders, Lisa M, PA-C  sodium chloride (OCEAN) 0.65 % SOLN nasal spray Place 1 spray into both nostrils as needed for congestion. Patient not taking: Reported on 11/18/2019 07/01/18   Emi HolesLaw, Alexandra M, PA-C    Allergies    Penicillins  Review of Systems   Review of Systems  Constitutional:  Negative for chills, diaphoresis, fatigue and fever.  HENT:  Negative for congestion, sore throat and trouble swallowing.   Eyes:  Negative for pain and visual disturbance.  Respiratory:  Positive for shortness of breath. Negative for cough and wheezing.   Cardiovascular:  Positive for chest pain. Negative for palpitations and leg swelling.  Gastrointestinal:  Negative for abdominal distention, abdominal pain, diarrhea, nausea and vomiting.  Genitourinary:  Negative for difficulty urinating.  Musculoskeletal:  Negative for back pain, neck pain and neck stiffness.  Skin:  Negative for pallor.  Neurological:  Negative for dizziness, speech difficulty, weakness and headaches.  Psychiatric/Behavioral:  Negative for confusion.    Physical Exam Updated Vital Signs BP (!) 159/99   Pulse 78   Temp 98.5 F (36.9 C) (Oral)   Resp 20   Ht 6\' 1"  (1.854 m)   Wt 72.6 kg   SpO2 100%   BMI 21.11 kg/m   Physical Exam Constitutional:      General: He is not in acute distress.    Appearance: Normal appearance. He is not ill-appearing, toxic-appearing or diaphoretic.  HENT:     Mouth/Throat:     Mouth: Mucous membranes are moist.     Pharynx: Oropharynx is clear.  Eyes:     General: No scleral icterus.    Extraocular Movements: Extraocular movements intact.     Pupils: Pupils are equal, round, and reactive to light.  Cardiovascular:     Rate and Rhythm: Normal rate and regular rhythm.     Pulses: Normal pulses.     Heart sounds: Normal heart sounds.  Pulmonary:     Effort: Pulmonary  effort is normal. No respiratory distress.     Breath sounds: Normal breath sounds. No stridor. No wheezing, rhonchi or rales.  Chest:     Chest wall: No tenderness.       Comments: Patient with tenderness to pectoralis area, worse when patient extends arm.  Reinspected above, no erythema or rashes noted.  Normal range of motion to shoulder with good strength to shoulder, elbow and wrist.  Good grip strength bilaterally.  Normal sensation bilaterally to upper extremities. Abdominal:     General: Abdomen is flat. There is no distension.     Palpations: Abdomen is soft.     Tenderness: There is no abdominal tenderness. There is no guarding or rebound.  Musculoskeletal:  General: No swelling or tenderness. Normal range of motion.     Cervical back: Normal range of motion and neck supple. No rigidity.     Right lower leg: No edema.     Left lower leg: No edema.  Skin:    General: Skin is warm and dry.     Capillary Refill: Capillary refill takes less than 2 seconds.     Coloration: Skin is not pale.  Neurological:     General: No focal deficit present.     Mental Status: He is alert and oriented to person, place, and time.  Psychiatric:        Mood and Affect: Mood normal.        Behavior: Behavior normal.    ED Results / Procedures / Treatments   Labs (all labs ordered are listed, but only abnormal results are displayed) Labs Reviewed  CBC WITH DIFFERENTIAL/PLATELET - Abnormal; Notable for the following components:      Result Value   Hemoglobin 12.1 (*)    HCT 36.6 (*)    RDW 16.1 (*)    All other components within normal limits  COMPREHENSIVE METABOLIC PANEL  TROPONIN I (HIGH SENSITIVITY)  TROPONIN I (HIGH SENSITIVITY)    EKG EKG Interpretation  Date/Time:  Saturday December 30 2020 13:33:22 EDT Ventricular Rate:  75 PR Interval:  158 QRS Duration: 114 QT Interval:  412 QTC Calculation: 460 R Axis:   92 Text Interpretation: ** Suspect arm lead reversal,  interpretation assumes no reversal Normal sinus rhythm Rightward axis Incomplete right bundle branch block Borderline ECG No significant change since last tracing Confirmed by Susy Frizzle [phone]) on 12/30/2020 1:43:30 PM  Radiology DG Chest Portable 1 View  Result Date: 12/30/2020 CLINICAL DATA:  Intermittent chest pain for the past 3-4 months. EXAM: PORTABLE CHEST 1 VIEW COMPARISON:  None FINDINGS: The cardiac silhouette, mediastinal and hilar contours are normal. The lungs are clear. No pleural effusions. No pulmonary lesions. The bony thorax is intact. IMPRESSION: No acute cardiopulmonary findings. Electronically Signed   By: Rudie Meyer M.D.   On: 12/30/2020 15:18    Procedures Procedures   Medications Ordered in ED Medications  ketorolac (TORADOL) 30 MG/ML injection 30 mg (30 mg Intravenous Given 12/30/20 1519)    ED Course  I have reviewed the triage vital signs and the nursing notes.  Pertinent labs & imaging results that were available during my care of the patient were reviewed by me and considered in my medical decision making (see chart for details).    MDM Rules/Calculators/A&P                           Patient presents to the emerge department today for intermittent chest pain for the past 4 months, worsening yesterday.  High suspicion that this is musculoskeletal in nature, however patient is anxious that there are other causes, will obtain ACS work-up at this time and reevaluate.HEAR score 3. PERC negative.  Work-up today unremarkable with CBC and CMP without any acute abnormalities. Hemoglobin not too far from baseline.  2 negative flat troponins.  Chest x-ray interpreted me without any acute cardiopulmonary disease.  EKG interpreted by me with no ST elevations or depressions, 2 by Dr. Bernette Mayers as well.  Upon reevaluation patient states that he feels much better with Toradol. Pain gone.  Chest pain is reproducible on exam, does explain that this is most likely  musculoskeletal.  Patient states that he needs a  refill of his blood pressure medication, will do that today as well.  Symptomatic treatment is cussed, patient will follow up with PCP.  If chest pain continues or worsens did also refer to cardiology for further management.  Doubt need for further emergent work up at this time. I explained the diagnosis and have given explicit precautions to return to the ER including for any other new or worsening symptoms. The patient understands and accepts the medical plan as it's been dictated and I have answered their questions. Discharge instructions concerning home care and prescriptions have been given. The patient is STABLE and is discharged to home in good condition.    Final Clinical Impression(s) / ED Diagnoses Final diagnoses:  Chest wall pain    Rx / DC Orders ED Discharge Orders          Ordered    naproxen (NAPROSYN) 500 MG tablet  2 times daily        12/30/20 1618    amLODipine (NORVASC) 5 MG tablet  Daily        12/30/20 1640             Farrel Gordon, PA-C 12/30/20 1643    Benjiman Core, MD 12/31/20 0005

## 2020-12-30 NOTE — ED Triage Notes (Signed)
Pt c/o intermittent chest pain for the past 3-4 months. Since yesterday, c/o constant left sided sharp chest pain with shortness of breath and numbness to left arm.

## 2021-03-13 ENCOUNTER — Emergency Department (HOSPITAL_BASED_OUTPATIENT_CLINIC_OR_DEPARTMENT_OTHER): Payer: Managed Care, Other (non HMO)

## 2021-03-13 ENCOUNTER — Other Ambulatory Visit: Payer: Self-pay

## 2021-03-13 ENCOUNTER — Other Ambulatory Visit (HOSPITAL_BASED_OUTPATIENT_CLINIC_OR_DEPARTMENT_OTHER): Payer: Self-pay

## 2021-03-13 ENCOUNTER — Emergency Department (HOSPITAL_BASED_OUTPATIENT_CLINIC_OR_DEPARTMENT_OTHER)
Admission: EM | Admit: 2021-03-13 | Discharge: 2021-03-13 | Disposition: A | Discharge status: Home / Self Care | Payer: Managed Care, Other (non HMO) | Attending: Emergency Medicine | Admitting: Emergency Medicine

## 2021-03-13 ENCOUNTER — Encounter (HOSPITAL_BASED_OUTPATIENT_CLINIC_OR_DEPARTMENT_OTHER): Payer: Self-pay | Admitting: *Deleted

## 2021-03-13 DIAGNOSIS — I10 Essential (primary) hypertension: Secondary | ICD-10-CM | POA: Diagnosis not present

## 2021-03-13 DIAGNOSIS — R0789 Other chest pain: Secondary | ICD-10-CM

## 2021-03-13 DIAGNOSIS — F17210 Nicotine dependence, cigarettes, uncomplicated: Secondary | ICD-10-CM | POA: Insufficient documentation

## 2021-03-13 DIAGNOSIS — Z79899 Other long term (current) drug therapy: Secondary | ICD-10-CM | POA: Insufficient documentation

## 2021-03-13 DIAGNOSIS — R0602 Shortness of breath: Secondary | ICD-10-CM | POA: Diagnosis present

## 2021-03-13 LAB — CBC WITH DIFFERENTIAL/PLATELET
Abs Immature Granulocytes: 0.02 10*3/uL (ref 0.00–0.07)
Basophils Absolute: 0.1 10*3/uL (ref 0.0–0.1)
Basophils Relative: 1 %
Eosinophils Absolute: 0 10*3/uL (ref 0.0–0.5)
Eosinophils Relative: 1 %
HCT: 36.9 % — ABNORMAL LOW (ref 39.0–52.0)
Hemoglobin: 12 g/dL — ABNORMAL LOW (ref 13.0–17.0)
Immature Granulocytes: 0 %
Lymphocytes Relative: 30 %
Lymphs Abs: 2.1 10*3/uL (ref 0.7–4.0)
MCH: 28.6 pg (ref 26.0–34.0)
MCHC: 32.5 g/dL (ref 30.0–36.0)
MCV: 88.1 fL (ref 80.0–100.0)
Monocytes Absolute: 0.7 10*3/uL (ref 0.1–1.0)
Monocytes Relative: 10 %
Neutro Abs: 4.2 10*3/uL (ref 1.7–7.7)
Neutrophils Relative %: 58 %
Platelets: 372 10*3/uL (ref 150–400)
RBC: 4.19 MIL/uL — ABNORMAL LOW (ref 4.22–5.81)
RDW: 14.8 % (ref 11.5–15.5)
WBC: 7.2 10*3/uL (ref 4.0–10.5)
nRBC: 0 % (ref 0.0–0.2)

## 2021-03-13 LAB — BASIC METABOLIC PANEL
Anion gap: 6 (ref 5–15)
BUN: 13 mg/dL (ref 6–20)
CO2: 26 mmol/L (ref 22–32)
Calcium: 8.9 mg/dL (ref 8.9–10.3)
Chloride: 104 mmol/L (ref 98–111)
Creatinine, Ser: 0.81 mg/dL (ref 0.61–1.24)
GFR, Estimated: >60 mL/min (ref >60)
Glucose, Bld: 89 mg/dL (ref 70–99)
Potassium: 3.8 mmol/L (ref 3.5–5.1)
Sodium: 136 mmol/L (ref 135–145)

## 2021-03-13 LAB — TROPONIN I (HIGH SENSITIVITY): Troponin I (High Sensitivity): 2 ng/L (ref <18)

## 2021-03-13 MED ORDER — AMLODIPINE BESYLATE 5 MG PO TABS
5.0000 mg | ORAL_TABLET | Freq: Every day | ORAL | 0 refills | Status: AC
  Filled 2021-03-13 – 2021-03-28 (×2): qty 30, 30d supply, fill #0

## 2021-03-13 NOTE — ED Notes (Signed)
No acute distress noted upon this RN's departure of patient. Provider made aware of elevated BP. - states ok for discharge. Verified discharge paperwork with name and DOB. Vital signs stable. Patient taken to checkout window. Discharge paperwork discussed with patient. No further questions voiced upon discharge.

## 2021-03-13 NOTE — Discharge Instructions (Addendum)
You were seen in the emergency department today for chest pain.  We discussed all of your lab work was normal, and this is very reassuring to me.  I think that your symptoms are likely related to muscular tenderness after playing with your kids from this weekend.    Please use Tylenol or ibuprofen for pain.  You may use 600 mg ibuprofen every 6 hours or 1000 mg of Tylenol every 6 hours.  You may choose to alternate between the 2.  This would be most effective.  Not to exceed 4 g of Tylenol within 24 hours.  Not to exceed 3200 mg ibuprofen 24 hours.   I also refilled your blood pressure medication, you can pick this up at the pharmacy.  Continue to monitor how you're doing and return to the ER for new or worsening symptoms such as consistent chest pain, difficulty breathing, lightheadedness or headaches, or vision changes.   It has been a pleasure seeing and caring for you today and I hope you start feeling better soon!

## 2021-03-13 NOTE — ED Triage Notes (Signed)
Light headed. He stopped taking his BP medication 2 weeks ago. 3 days of SOB.

## 2021-03-13 NOTE — ED Provider Notes (Signed)
[facility] EMERGENCY DEPARTMENT Provider Note   CSN: [id number] Arrival date & time: 03/13/21  1121     History Chief Complaint  Patient presents with   Shortness of Breath    Kenneth Garrison is a 30 y.o. male with history of hypertension who presents with chest pain, shortness of breath, and lightheadedness x3 days.  Patient states that he was playing with his kids over the weekend, noted that he had soreness in the middle of his chest, which is worse with palpation and movement.  He has had difficulty breathing, which he had associated this with upper respiratory symptoms as his sinuses have been "stopped up".  He has concerns that this could be related to his heart.  He has been out of his hypertension medication for about a month due to cost.  He has had lightheadedness over the weekend, but states this along with the shortness of breath has completely resolved.  Today he is solely complaining about the pain in his chest.  Recent injury or illness.   Shortness of Breath Associated symptoms: chest pain   Associated symptoms: no abdominal pain, no cough, no fever, no headaches, no sore throat and no vomiting       Past Medical History:  Diagnosis Date   Heart murmur    Migraines     There are no problems to display for this patient.   History reviewed. No pertinent surgical history.     Family History  Problem Relation Age of Onset   Stroke Father    Heart disease Father    Diabetes Father     Social History   Tobacco Use   Smoking status: Every Day    Packs/day: 0.25    Types: Cigarettes   Smokeless tobacco: Never  Vaping Use   Vaping Use: Never used  Substance Use Topics   Alcohol use: Yes   Drug use: Never    Home Medications Prior to Admission medications   Medication Sig Start Date End Date Taking? Authorizing Provider  amLODipine (NORVASC) 5 MG tablet Take 1 tablet (5 mg total) by mouth daily. 03/13/21  Yes Brienne Liguori T, PA-C   acetaminophen (TYLENOL) 500 MG tablet Take 1 tablet (500 mg total) by mouth every 6 (six) hours as needed. 07/01/18   Law, Waylan Boga, PA-C  ALPRAZolam Prudy Feeler) 0.25 MG tablet Take 0.25 mg by mouth once. Pt got 2 0.25mg  tablets in order to help him sleep due to his illness as this time.    [provider]  cetirizine-pseudoephedrine (ZYRTEC-D) 5-120 MG tablet Take 1 tablet by mouth daily. Patient not taking: No sig reported 07/01/18   Emi Holes, PA-C  docusate sodium (COLACE) 100 MG capsule Take 1 capsule (100 mg total) by mouth every 12 (twelve) hours. Patient not taking: No sig reported 06/23/17   Elson Areas, PA-C  hydrocortisone (ANUSOL-HC) 2.5 % rectal cream Apply rectally 2 times daily Patient not taking: No sig reported 06/23/17   Elson Areas, PA-C  ibuprofen (ADVIL,MOTRIN) 600 MG tablet Take 1 tablet (600 mg total) by mouth every 6 (six) hours as needed. Patient not taking: No sig reported 07/01/18   Emi Holes, PA-C  lidocaine (XYLOCAINE) 2 % solution Use as directed 15 mLs in the mouth or throat as needed for mouth pain. 11/18/19   Henderly, Britni A, PA-C  loperamide (IMODIUM) 2 MG capsule Take 1 capsule (2 mg total) by mouth 4 (four) times daily as needed for diarrhea or  loose stools. Patient not taking: No sig reported 09/03/18   Garlon Hatchet, PA-C  ondansetron (ZOFRAN ODT) 4 MG disintegrating tablet Take 1 tablet (4 mg total) by mouth every 8 (eight) hours as needed for nausea. Patient not taking: No sig reported 09/03/18   Garlon Hatchet, PA-C  sodium chloride (OCEAN) 0.65 % SOLN nasal spray Place 1 spray into both nostrils as needed for congestion. Patient not taking: No sig reported 07/01/18   Emi Holes, PA-C    Allergies    Penicillins  Review of Systems   Review of Systems  Constitutional:  Negative for chills and fever.  HENT:  Positive for congestion. Negative for sore throat.   Eyes:  Negative for visual disturbance.  Respiratory:  Positive  for shortness of breath. Negative for cough.   Cardiovascular:  Positive for chest pain. Negative for palpitations and leg swelling.  Gastrointestinal:  Negative for abdominal pain, nausea and vomiting.  Neurological:  Positive for light-headedness. Negative for dizziness, syncope, weakness and headaches.  All other systems reviewed and are negative.  Physical Exam Updated Vital Signs BP (!) 148/102   Pulse 86   Temp 98.1 F (36.7 C) (Oral)   Resp 16   Ht 6\' 1"  (1.854 m)   Wt 72.6 kg   SpO2 99%   BMI 21.12 kg/m   Physical Exam Vitals and nursing note reviewed.  Constitutional:      Appearance: Normal appearance.  HENT:     Head: Normocephalic and atraumatic.  Eyes:     Conjunctiva/sclera: Conjunctivae normal.  Cardiovascular:     Rate and Rhythm: Normal rate and regular rhythm.  Pulmonary:     Effort: Pulmonary effort is normal. No respiratory distress.     Breath sounds: Normal breath sounds.  Chest:     Comments: Reproducible sternal chest pain Abdominal:     General: There is no distension.     Palpations: Abdomen is soft.     Tenderness: There is no abdominal tenderness.  Skin:    General: Skin is warm and dry.  Neurological:     General: No focal deficit present.     Mental Status: He is alert.    ED Results / Procedures / Treatments   Labs (all labs ordered are listed, but only abnormal results are displayed) Labs Reviewed  CBC WITH DIFFERENTIAL/PLATELET - Abnormal; Notable for the following components:      Result Value   RBC 4.19 (*)    Hemoglobin 12.0 (*)    HCT 36.9 (*)    All other components within normal limits  BASIC METABOLIC PANEL  TROPONIN I (HIGH SENSITIVITY)    EKG EKG Interpretation  Date/Time:  Tuesday March 13 2021 11:31:02 EDT Ventricular Rate:  79 PR Interval:  160 QRS Duration: 108 QT Interval:  382 QTC Calculation: 438 R Axis:   89 Text Interpretation: Normal sinus rhythm Normal ECG when compared to prior, similar  appearance. No STEMI Confirmed by [id number] (Theda Belfast) on 03/13/2021 4:38:51 PM  Radiology DG Chest 2 View  Result Date: 03/13/2021 CLINICAL DATA:  chest pain EXAM: CHEST - 2 VIEW COMPARISON:  August 6 FINDINGS: The cardiomediastinal silhouette is within normal limits. No pleural effusion. No pneumothorax. No mass or consolidation. No acute osseous abnormality. IMPRESSION: No acute abnormality in the chest. Electronically Signed   By: 06-19-1985 M.D.   On: 03/13/2021 16:30    Procedures Procedures   Medications Ordered in ED Medications - No data to display  ED Course  I have reviewed the triage vital signs and the nursing notes.  Pertinent labs & imaging results that were available during my care of the patient were reviewed by me and considered in my medical decision making (see chart for details).    MDM Rules/Calculators/A&P                           Patient is 30 year old male with a history of hypertension who presents with chest pain, with associated shortness of breath and lightheadedness over the weekend.  Patient states that symptoms began after playing with his kids.  Chest pain is worse with palpation and movement.  He states that he has been out of his hypertension medication for about a month due to cost.  He has concerns today that his symptoms could be related to his heart.  No recent injury or illness.  On exam patient is in no acute distress, but he appears anxious. Not tachycardic and not hypoxic.  EKG normal sinus rhythm.  Plan for ACS rule out.  Low suspicion for infection as he is afebrile, no cough, and lung sounds are clear to auscultation in all fields.  Lab work is unremarkable. Chest x-ray negative for acute cardiopulmonary abnormality.  PERC negative for PE.  Troponin of 2. Low concern for acute cardiac event, patient has a heart score of 2.   Patient is otherwise stable and not requiring admission or inpatient treatment for symptoms at this time.  Has  been hypertensive while in the emergency department. Plan to discharge home with refill for his hypertension medication.  Given strict return precautions. Patient agreeable to plan.   Final Clinical Impression(s) / ED Diagnoses Final diagnoses:  Chest wall pain  Primary hypertension    Rx / DC Orders ED Discharge Orders          Ordered    amLODipine (NORVASC) 5 MG tablet  Daily        03/13/21 1731             Daiquan Resnik T, PA-C 03/13/21 1740    Horton, Clabe Seal, DO 03/14/21 1744

## 2021-03-20 ENCOUNTER — Other Ambulatory Visit (HOSPITAL_BASED_OUTPATIENT_CLINIC_OR_DEPARTMENT_OTHER): Payer: Self-pay

## 2021-03-28 ENCOUNTER — Other Ambulatory Visit (HOSPITAL_BASED_OUTPATIENT_CLINIC_OR_DEPARTMENT_OTHER): Payer: Self-pay

## 2021-04-06 ENCOUNTER — Other Ambulatory Visit (HOSPITAL_BASED_OUTPATIENT_CLINIC_OR_DEPARTMENT_OTHER): Payer: Self-pay

## 2021-04-07 ENCOUNTER — Emergency Department (HOSPITAL_COMMUNITY)
Admission: EM | Admit: 2021-04-07 | Discharge: 2021-04-07 | Disposition: A | Discharge status: Home / Self Care | Payer: Managed Care, Other (non HMO) | Attending: Emergency Medicine | Admitting: Emergency Medicine

## 2021-04-07 ENCOUNTER — Encounter (HOSPITAL_COMMUNITY): Payer: Self-pay

## 2021-04-07 ENCOUNTER — Other Ambulatory Visit: Payer: Self-pay

## 2021-04-07 DIAGNOSIS — Z20822 Contact with and (suspected) exposure to covid-19: Secondary | ICD-10-CM | POA: Diagnosis not present

## 2021-04-07 DIAGNOSIS — J020 Streptococcal pharyngitis: Secondary | ICD-10-CM | POA: Insufficient documentation

## 2021-04-07 DIAGNOSIS — J029 Acute pharyngitis, unspecified: Secondary | ICD-10-CM | POA: Diagnosis present

## 2021-04-07 DIAGNOSIS — Z79899 Other long term (current) drug therapy: Secondary | ICD-10-CM | POA: Diagnosis not present

## 2021-04-07 DIAGNOSIS — F17210 Nicotine dependence, cigarettes, uncomplicated: Secondary | ICD-10-CM | POA: Insufficient documentation

## 2021-04-07 LAB — RESP PANEL BY RT-PCR (FLU A&B, COVID) ARPGX2
Influenza A by PCR: NEGATIVE
Influenza B by PCR: NEGATIVE
SARS Coronavirus 2 by RT PCR: NEGATIVE

## 2021-04-07 LAB — GROUP A STREP BY PCR: Group A Strep by PCR: DETECTED — AB

## 2021-04-07 MED ORDER — AZITHROMYCIN 500 MG PO TABS: 500.0000 mg | ORAL_TABLET | Freq: Every day | ORAL | 0 refills | Status: AC

## 2021-04-07 NOTE — ED Provider Notes (Signed)
[facility] DEPT Provider Note   CSN: [id number] Arrival date & time: 04/07/21  1226     History Chief Complaint  Patient presents with   Sore Throat    Kenneth Garrison is a 30 y.o. male who presents the emergency department for evaluation of sore throat, rhinorrhea, nasal congestion, myalgias the past 3 days.  Patient denies any exacerbating relieving factors.  Reports the pain is mild to minor and gradually improving, he does not make sure he did not have strep throat before going to work.  Denies any chest pain, shortness of breath, fevers, ear pain, blurry vision, eye discharge.  Denies any medical, surgical history denies any daily medications.  Allergic to penicillin..  Daily smoker.  2-3 alcoholic drinks a week.  Denies any drugs.   Sore Throat Pertinent negatives include no chest pain, no abdominal pain and no shortness of breath.      Past Medical History:  Diagnosis Date   Heart murmur    Migraines     There are no problems to display for this patient.   History reviewed. No pertinent surgical history.     Family History  Problem Relation Age of Onset   Stroke Father    Heart disease Father    Diabetes Father     Social History   Tobacco Use   Smoking status: Every Day    Packs/day: 0.25    Types: Cigarettes   Smokeless tobacco: Never  Vaping Use   Vaping Use: Never used  Substance Use Topics   Alcohol use: Yes   Drug use: Never    Home Medications Prior to Admission medications   Medication Sig Start Date End Date Taking? Authorizing Provider  azithromycin (ZITHROMAX) 500 MG tablet Take 1 tablet (500 mg total) by mouth daily. 04/07/21  Yes Sherrell Puller, PA-C  acetaminophen (TYLENOL) 500 MG tablet Take 1 tablet (500 mg total) by mouth every 6 (six) hours as needed. 07/01/18   Law, Bea Graff, PA-C  ALPRAZolam Duanne Moron) 0.25 MG tablet Take 0.25 mg by mouth once. Pt got 2 0.25mg  tablets in order to help him sleep due to  his illness as this time.    [provider]  amLODipine (NORVASC) 5 MG tablet Take 1 tablet (5 mg total) by mouth daily. 03/13/21   Roemhildt, Lorin T, PA-C  cetirizine-pseudoephedrine (ZYRTEC-D) 5-120 MG tablet Take 1 tablet by mouth daily. Patient not taking: No sig reported 07/01/18   Frederica Kuster, PA-C  docusate sodium (COLACE) 100 MG capsule Take 1 capsule (100 mg total) by mouth every 12 (twelve) hours. Patient not taking: No sig reported 06/23/17   Fransico Meadow, PA-C  hydrocortisone (ANUSOL-HC) 2.5 % rectal cream Apply rectally 2 times daily Patient not taking: No sig reported 06/23/17   Fransico Meadow, PA-C  ibuprofen (ADVIL,MOTRIN) 600 MG tablet Take 1 tablet (600 mg total) by mouth every 6 (six) hours as needed. Patient not taking: No sig reported 07/01/18   Frederica Kuster, PA-C  lidocaine (XYLOCAINE) 2 % solution Use as directed 15 mLs in the mouth or throat as needed for mouth pain. 11/18/19   Henderly, Britni A, PA-C  loperamide (IMODIUM) 2 MG capsule Take 1 capsule (2 mg total) by mouth 4 (four) times daily as needed for diarrhea or loose stools. Patient not taking: No sig reported 09/03/18   Larene Pickett, PA-C  ondansetron (ZOFRAN ODT) 4 MG disintegrating tablet Take 1 tablet (4 mg total) by mouth every  8 (eight) hours as needed for nausea. Patient not taking: No sig reported 09/03/18   Garlon Hatchet, PA-C  sodium chloride (OCEAN) 0.65 % SOLN nasal spray Place 1 spray into both nostrils as needed for congestion. Patient not taking: No sig reported 07/01/18   Emi Holes, PA-C    Allergies    Penicillins  Review of Systems   Review of Systems  Constitutional:  Negative for chills and fever.  HENT:  Positive for congestion, rhinorrhea and sore throat. Negative for dental problem, ear discharge and ear pain.   Eyes:  Negative for pain and visual disturbance.  Respiratory:  Negative for cough and shortness of breath.   Cardiovascular:  Negative for chest pain  and palpitations.  Gastrointestinal:  Negative for abdominal pain and vomiting.  Genitourinary:  Negative for dysuria and hematuria.  Musculoskeletal:  Negative for arthralgias and back pain.  Skin:  Negative for color change and rash.  Neurological:  Negative for seizures and syncope.  All other systems reviewed and are negative.  Physical Exam Updated Vital Signs BP (!) 158/100   Pulse 96   Temp 98.4 F (36.9 C) (Oral)   Resp 18   Ht 6\' 1"  (1.854 m)   Wt 72.6 kg   SpO2 96%   BMI 21.11 kg/m   Physical Exam Vitals and nursing note reviewed.  Constitutional:      General: He is not in acute distress.    Appearance: Normal appearance. He is not toxic-appearing.     Comments: Normal speech  HENT:     Head: Normocephalic and atraumatic.     Right Ear: Tympanic membrane and ear canal normal. No tenderness. Tympanic membrane is not erythematous.     Left Ear: Tympanic membrane and ear canal normal. No tenderness. Tympanic membrane is not erythematous.     Nose: No congestion or rhinorrhea.     Mouth/Throat:     Mouth: Mucous membranes are moist.     Pharynx: Oropharynx is clear. Uvula midline. No oropharyngeal exudate, posterior oropharyngeal erythema or uvula swelling.     Comments: Mild tonsillar edema right greater than left.  Uvula midline.  Airway patent.  No pooling of secretions.  Normal speech. Eyes:     General: No scleral icterus. Cardiovascular:     Rate and Rhythm: Normal rate and regular rhythm.  Pulmonary:     Effort: Pulmonary effort is normal. No respiratory distress.     Breath sounds: Normal breath sounds. No wheezing.  Abdominal:     General: Abdomen is flat. Bowel sounds are normal. There is no distension.     Palpations: Abdomen is soft.     Tenderness: There is no abdominal tenderness. There is no guarding or rebound.  Musculoskeletal:        General: No deformity.     Cervical back: Normal range of motion.  Skin:    General: Skin is warm and dry.   Neurological:     General: No focal deficit present.     Mental Status: He is alert. Mental status is at baseline.    ED Results / Procedures / Treatments   Labs (all labs ordered are listed, but only abnormal results are displayed) Labs Reviewed  GROUP A STREP BY PCR - Abnormal; Notable for the following components:      Result Value   Group A Strep by PCR DETECTED (*)    All other components within normal limits  RESP PANEL BY RT-PCR (FLU A&B, COVID) ARPGX2  EKG None  Radiology No results found.  Procedures Procedures   Medications Ordered in ED Medications - No data to display  ED Course  I have reviewed the triage vital signs and the nursing notes.  Pertinent labs & imaging results that were available during my care of the patient were reviewed by me and considered in my medical decision making (see chart for details).  30 year old male presents with sore throat and flulike symptoms.  No fevers.  Afebrile in the ED.  COVID, flu, and strep obtained.  Low suspicion for PTA.  Right tonsil mild edema in comparison to left.  No pharyngeal erythema or exudate.  Uvula midline.  Airway patent.  Patient speech normal.  Controlling secretions.  Strep a positive.  Patient allergic to penicillin.  Prescribed azithromycin 5 mg p.o. once daily for next 5 days.  Recommended, given his needed for pain.  Recommended directly to depression surgeries with anyone.  Recommended staying well-hydrated with fluids, mainly water.  Return precautions given.  Patient is a plan.  Patient is stable and being discharged home in good condition.    MDM Rules/Calculators/A&P                          Final Clinical Impression(s) / ED Diagnoses Final diagnoses:  Strep throat    Rx / DC Orders ED Discharge Orders          Ordered    azithromycin (ZITHROMAX) 500 MG tablet  Daily        04/07/21 1437             Sherrell Puller, PA-C 04/07/21 1445    Tegeler, Gwenyth Allegra, MD 04/07/21  1506

## 2021-04-07 NOTE — Discharge Instructions (Addendum)
You were seen today and diagnosed with strep throat.  Please complete the course of your antibiotic.  Take Tylenol ibuprofen as needed for pain.  Is important that you drink plenty of fluids and stay well-hydrated with mainly water.  If any concern, new or worsening symptoms, please return to the nearest emergency department.

## 2021-04-07 NOTE — ED Triage Notes (Signed)
Sore/nasal congestion x2 days

## 2021-05-08 ENCOUNTER — Encounter (HOSPITAL_COMMUNITY): Payer: Self-pay

## 2021-05-08 ENCOUNTER — Emergency Department (HOSPITAL_COMMUNITY)
Admission: EM | Admit: 2021-05-08 | Discharge: 2021-05-08 | Disposition: A | Discharge status: Home / Self Care | Payer: Managed Care, Other (non HMO) | Attending: Emergency Medicine | Admitting: Emergency Medicine

## 2021-05-08 DIAGNOSIS — G5602 Carpal tunnel syndrome, left upper limb: Secondary | ICD-10-CM | POA: Diagnosis not present

## 2021-05-08 DIAGNOSIS — M79642 Pain in left hand: Secondary | ICD-10-CM

## 2021-05-08 DIAGNOSIS — S61211A Laceration without foreign body of left index finger without damage to nail, initial encounter: Secondary | ICD-10-CM | POA: Diagnosis present

## 2021-05-08 DIAGNOSIS — F17210 Nicotine dependence, cigarettes, uncomplicated: Secondary | ICD-10-CM | POA: Diagnosis not present

## 2021-05-08 DIAGNOSIS — X58XXXA Exposure to other specified factors, initial encounter: Secondary | ICD-10-CM | POA: Insufficient documentation

## 2021-05-08 NOTE — ED Provider Notes (Signed)
[facility]-EMERGENCY DEPT Provider Note   CSN: [id number] Arrival date & time: 05/08/21  1042     History Chief Complaint  Patient presents with   Hand Pain    Kenneth Garrison is a 30 y.o. male with no significant past medical history presents with left hand pain, occasional numbness and tingling of the second and third digit for the last month.  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  Patient also reports that he has a laceration to the index finger of the left hand, and some of the pain has been worse since then.  Patient denies numbness or tingling of the thumb.  Patient reports 0 out of 10 pain at rest, 6 out of 10 pain with use.  Patient denies any other numbness, tingling.  Patient denies recent injury to the hand.  Patient has no open lacerations or other injuries to the left hand.  Patient has not tried anything for pain at this time.   Hand Pain      Past Medical History:  Diagnosis Date   Heart murmur    Migraines     There are no problems to display for this patient.   History reviewed. No pertinent surgical history.     Family History  Problem Relation Age of Onset   Stroke Father    Heart disease Father    Diabetes Father     Social History   Tobacco Use   Smoking status: Every Day    Packs/day: 0.25    Types: Cigarettes   Smokeless tobacco: Never  Vaping Use   Vaping Use: Never used  Substance Use Topics   Alcohol use: Yes   Drug use: Never    Home Medications Prior to Admission medications   Medication Sig Start Date End Date Taking? Authorizing Provider  acetaminophen (TYLENOL) 500 MG tablet Take 1 tablet (500 mg total) by mouth every 6 (six) hours as needed. 07/01/18   Law, Waylan Boga, PA-C  ALPRAZolam Prudy Feeler) 0.25 MG tablet Take 0.25 mg by mouth once. Pt got 2 0.25mg  tablets in order to help him sleep due to his illness as this time.    [provider]   amLODipine (NORVASC) 5 MG tablet Take 1 tablet (5 mg total) by mouth daily. 03/13/21   Roemhildt, Lorin T, PA-C  azithromycin (ZITHROMAX) 500 MG tablet Take 1 tablet (500 mg total) by mouth daily. 04/07/21   Achille Rich, PA-C  cetirizine-pseudoephedrine (ZYRTEC-D) 5-120 MG tablet Take 1 tablet by mouth daily. Patient not taking: No sig reported 07/01/18   Emi Holes, PA-C  docusate sodium (COLACE) 100 MG capsule Take 1 capsule (100 mg total) by mouth every 12 (twelve) hours. Patient not taking: No sig reported 06/23/17   Elson Areas, PA-C  hydrocortisone (ANUSOL-HC) 2.5 % rectal cream Apply rectally 2 times daily Patient not taking: No sig reported 06/23/17   Elson Areas, PA-C  ibuprofen (ADVIL,MOTRIN) 600 MG tablet Take 1 tablet (600 mg total) by mouth every 6 (six) hours as needed. Patient not taking: No sig reported 07/01/18   Emi Holes, PA-C  lidocaine (XYLOCAINE) 2 % solution Use as directed 15 mLs in the mouth or throat as needed for mouth pain. 11/18/19   Henderly, Britni A, PA-C  loperamide (IMODIUM) 2 MG capsule Take 1 capsule (2 mg total) by mouth 4 (four) times daily as needed for diarrhea or loose stools. Patient not taking: No sig  reported 09/03/18   Garlon Hatchet, PA-C  ondansetron (ZOFRAN ODT) 4 MG disintegrating tablet Take 1 tablet (4 mg total) by mouth every 8 (eight) hours as needed for nausea. Patient not taking: No sig reported 09/03/18   Garlon Hatchet, PA-C  sodium chloride (OCEAN) 0.65 % SOLN nasal spray Place 1 spray into both nostrils as needed for congestion. Patient not taking: No sig reported 07/01/18   Emi Holes, PA-C    Allergies    Penicillins  Review of Systems   Review of Systems  Musculoskeletal:  Positive for joint swelling.  All other systems reviewed and are negative.  Physical Exam Updated Vital Signs BP (!) 152/120 (BP Location: Right Arm)    Pulse 72    Temp 98 F (36.7 C) (Oral)    Resp 18    SpO2 100%   Physical  Exam Vitals and nursing note reviewed.  Constitutional:      General: He is not in acute distress.    Appearance: Normal appearance.  HENT:     Head: Normocephalic and atraumatic.  Eyes:     General:        Right eye: No discharge.        Left eye: No discharge.  Cardiovascular:     Rate and Rhythm: Normal rate and regular rhythm.     Pulses: Normal pulses.  Pulmonary:     Effort: Pulmonary effort is normal. No respiratory distress.  Musculoskeletal:        General: No deformity.     Comments: Patient has intact strength to flexion, extension of the left wrist.  Intact trying to flexion extension of all digits of the left hand.  Intact opposition of the thumb.  Patient has negative Tinel sign, however he does have replication of the symptoms with hyperflexion of the left wrist.  Skin:    General: Skin is warm and dry.     Comments: There is evidence of a well-healed scar without redness or inflammation overlying the proximal knuckle dorsally of the index finger.  There is no other evidence of redness, swelling, deformity.  Neurological:     Mental Status: He is alert and oriented to person, place, and time.  Psychiatric:        Mood and Affect: Mood normal.        Behavior: Behavior normal.    ED Results / Procedures / Treatments   Labs (all labs ordered are listed, but only abnormal results are displayed) Labs Reviewed - No data to display  EKG None  Radiology No results found.  Procedures Procedures   Medications Ordered in ED Medications - No data to display  ED Course  I have reviewed the triage vital signs and the nursing notes.  Pertinent labs & imaging results that were available during my care of the patient were reviewed by me and considered in my medical decision making (see chart for details).    MDM Rules/Calculators/A&P                         Overall well-appearing male with 1 month of left hand pain, occasional numbness or tingling of the second  and third digits.  While there is no numbness or tingling of the third digit, and negative Tinel sign, I do believe that there is some aspect of carpal tunnel syndrome versus other median nerve dysfunction at play.  Neurovascularly intact left extremity, intact radial, ulnar pulses, and intact strength.  Discussed I recommend a brace, ice, rest, Tylenol, ibuprofen for pain, and to follow-up with orthopedics for further evaluation.  Patient understands and agrees to plan, discharged in stable condition at this time. Final Clinical Impression(s) / ED Diagnoses Final diagnoses:  Left hand pain  Carpal tunnel syndrome of left wrist    Rx / DC Orders ED Discharge Orders     None        [place] 05/08/21 2129    Bethann Berkshire, MD 05/09/21 [phone]

## 2021-05-08 NOTE — Discharge Instructions (Signed)
Please use Tylenol or ibuprofen for pain.  You may use 600 mg ibuprofen every 6 hours or 1000 mg of Tylenol every 6 hours.  You may choose to alternate between the 2.  This would be most effective.  Not to exceed 4 g of Tylenol within 24 hours.  Not to exceed 3200 mg ibuprofen 24 hours.  Additionally I recommend using the brace while working to reduce straining of the wrist and hand. I recommend icing the affected area and resting when you are not working. Please follow up with orthopedics if your pain worsens or fails to improve despite treatment.

## 2021-05-08 NOTE — ED Notes (Signed)
Wrist brace applied to patient left wrist

## 2021-05-08 NOTE — ED Triage Notes (Addendum)
Pt arrived via POV, c/o hand pain and numbness, worsening pain with extended use x3 months.

## 2022-04-13 IMAGING — DX DG FINGER INDEX 2+V*L*
3 series · 3 of 3 positions shown · non-contrast
Comparison: None.

CLINICAL DATA: Pt reports he was cooking and cut his L index
finger, appx 2cm lac, moderate amount of bleeding, dressing applied
in triage

EXAM:
LEFT INDEX FINGER 2+V

[finger ap]
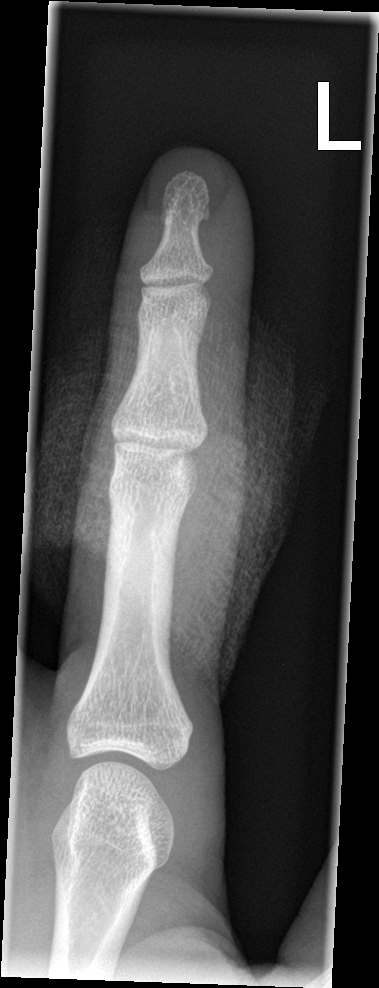

[finger obl]
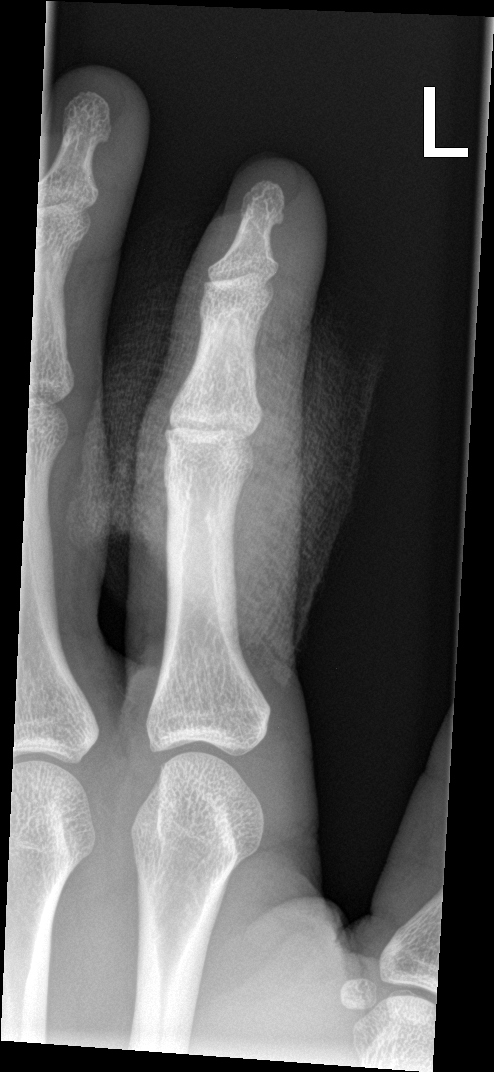

[finger lat]
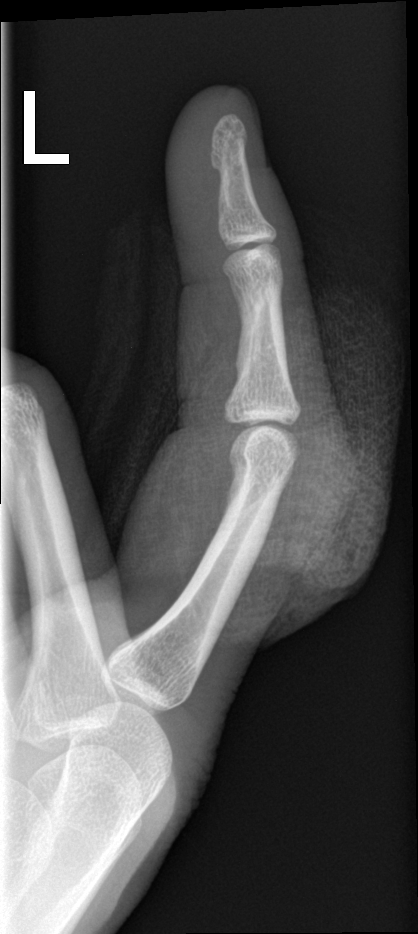

[3 of 3 positions shown; findings below may reference images not displayed]

FINDINGS: There is no evidence of fracture or dislocation. There is no
evidence of arthropathy or other focal bone abnormality. Subcutaneus
soft tissue edema. No laceration not well visualized. No retained
radiopaque foreign body; however, slightly limited evaluation due to
overlying bandage.
IMPRESSION: 1. No acute displaced fracture or dislocation.
2. No retained radiopaque foreign body.

## 2022-09-17 IMAGING — DX DG CHEST 1V PORT
2 series · 2 of 2 positions shown · non-contrast
Comparison: None

CLINICAL DATA: Intermittent chest pain for the past 3-4 months.

EXAM:
PORTABLE CHEST 1 VIEW

[chest ap (1 of 2)]
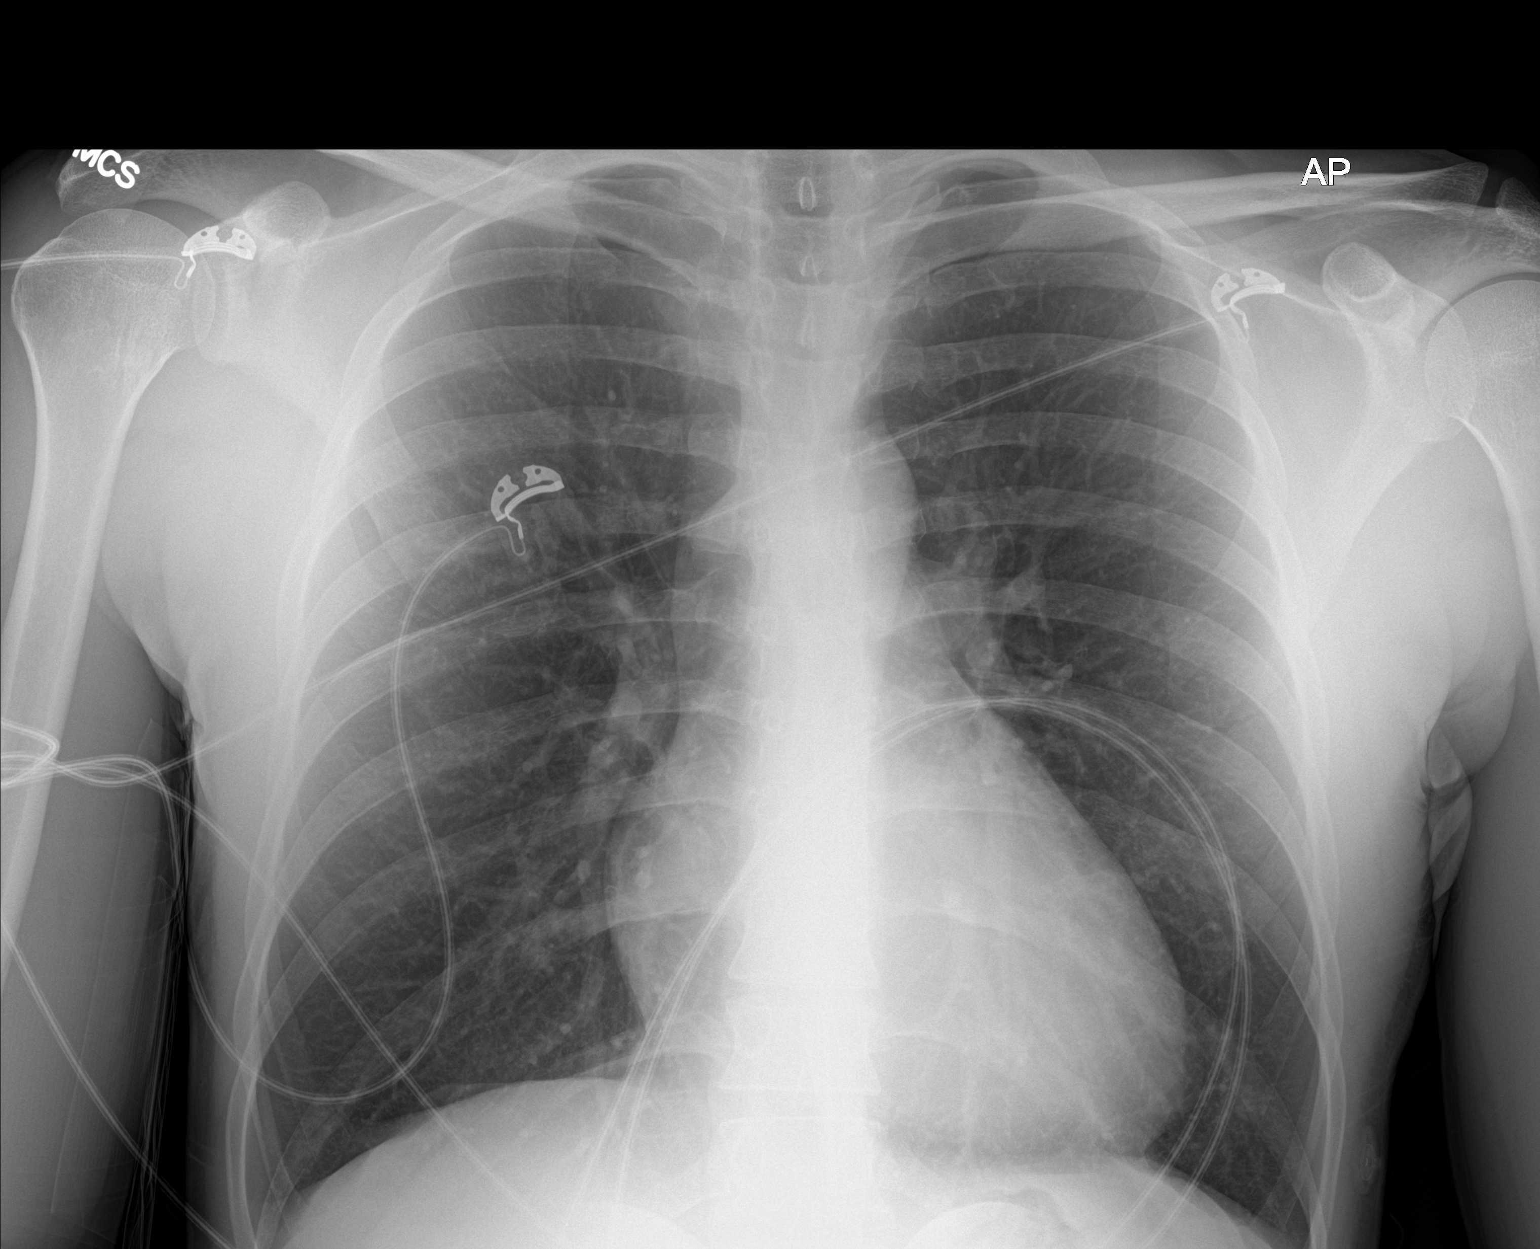

[chest ap (2 of 2)]
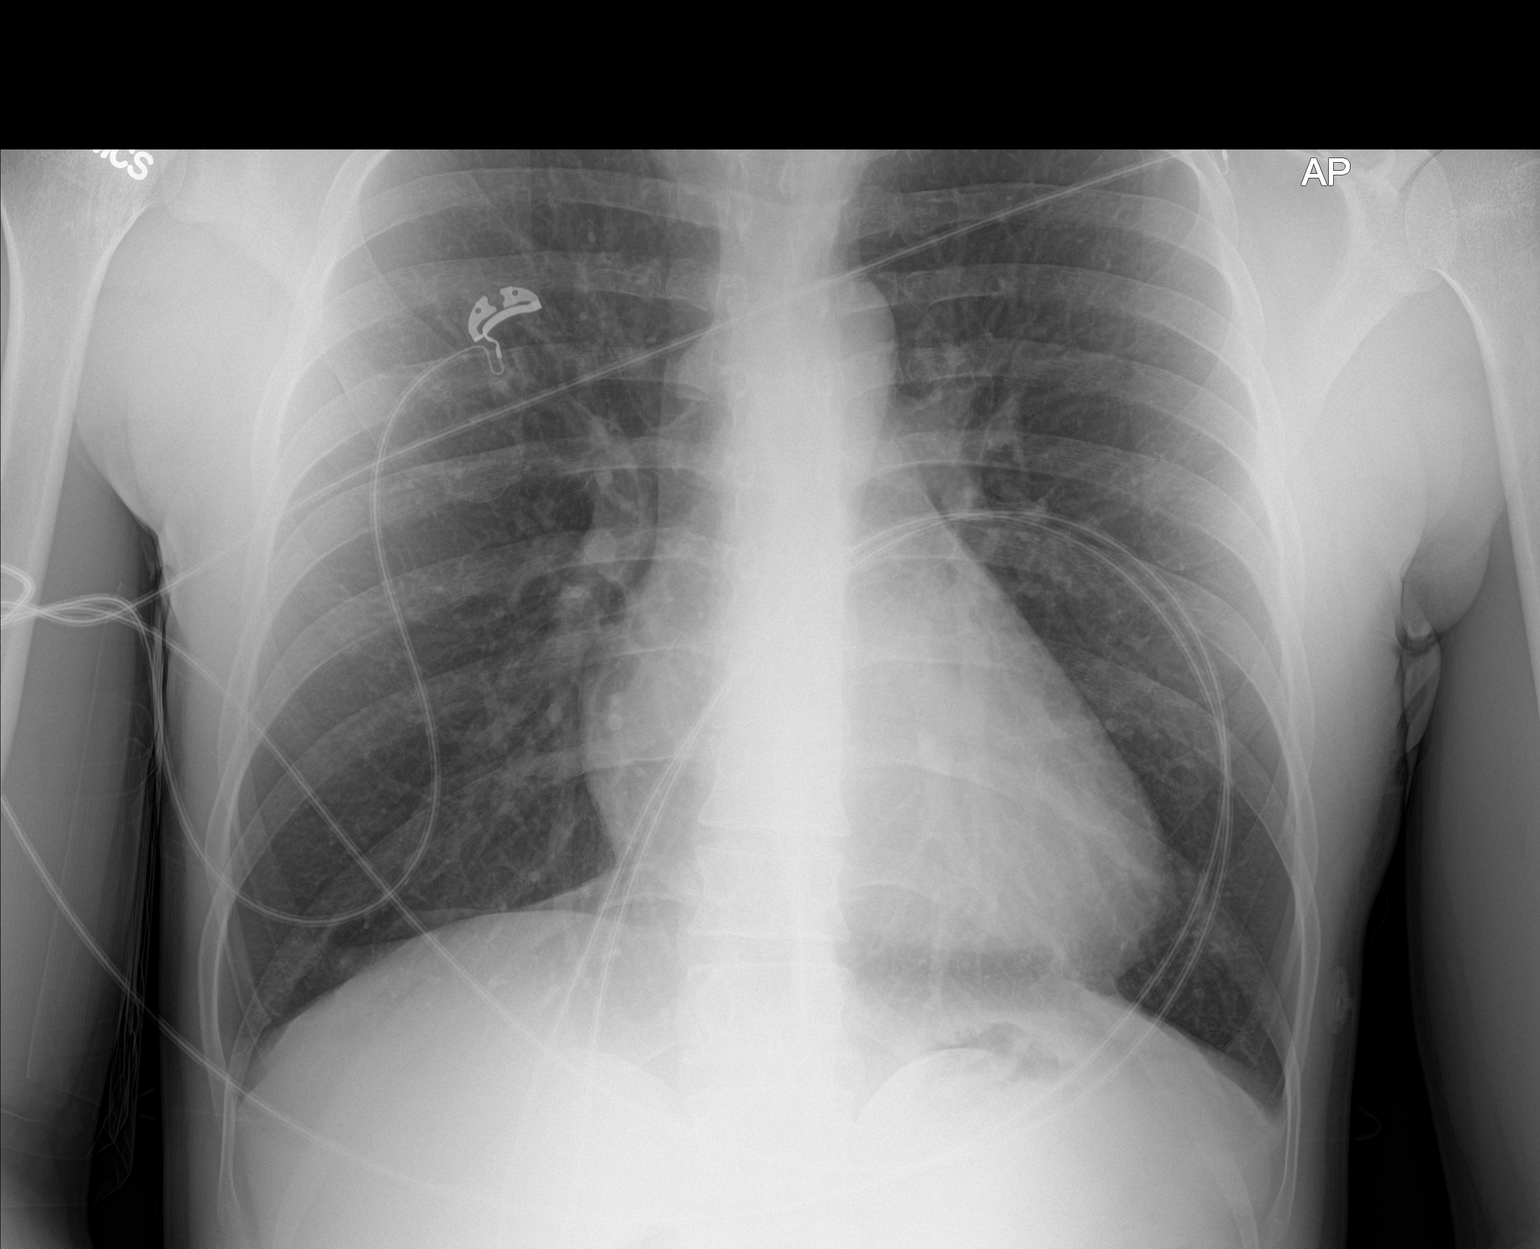

[2 of 2 positions shown; findings below may reference images not displayed]

FINDINGS: The cardiac silhouette, mediastinal and hilar contours are normal.
The lungs are clear. No pleural effusions. No pulmonary lesions. The
bony thorax is intact.
IMPRESSION: No acute cardiopulmonary findings.

## 2023-06-10 ENCOUNTER — Encounter (HOSPITAL_COMMUNITY): Payer: Self-pay | Admitting: Emergency Medicine

## 2023-06-10 ENCOUNTER — Emergency Department (HOSPITAL_COMMUNITY)
Admission: EM | Admit: 2023-06-10 | Discharge: 2023-06-10 | Disposition: A | Discharge status: Home / Self Care | Payer: Managed Care, Other (non HMO) | Attending: Emergency Medicine | Admitting: Emergency Medicine

## 2023-06-10 ENCOUNTER — Emergency Department (HOSPITAL_COMMUNITY): Payer: Managed Care, Other (non HMO)

## 2023-06-10 DIAGNOSIS — B338 Other specified viral diseases: Secondary | ICD-10-CM

## 2023-06-10 DIAGNOSIS — Z79899 Other long term (current) drug therapy: Secondary | ICD-10-CM | POA: Insufficient documentation

## 2023-06-10 DIAGNOSIS — B974 Respiratory syncytial virus as the cause of diseases classified elsewhere: Secondary | ICD-10-CM | POA: Insufficient documentation

## 2023-06-10 DIAGNOSIS — I10 Essential (primary) hypertension: Secondary | ICD-10-CM | POA: Insufficient documentation

## 2023-06-10 DIAGNOSIS — Z20822 Contact with and (suspected) exposure to covid-19: Secondary | ICD-10-CM | POA: Insufficient documentation

## 2023-06-10 DIAGNOSIS — J069 Acute upper respiratory infection, unspecified: Secondary | ICD-10-CM | POA: Insufficient documentation

## 2023-06-10 DIAGNOSIS — R509 Fever, unspecified: Secondary | ICD-10-CM | POA: Diagnosis present

## 2023-06-10 HISTORY — DX: Essential (primary) hypertension: I10

## 2023-06-10 LAB — BASIC METABOLIC PANEL
Anion gap: 9 (ref 5–15)
BUN: 11 mg/dL (ref 6–20)
CO2: 24 mmol/L (ref 22–32)
Calcium: 9.2 mg/dL (ref 8.9–10.3)
Chloride: 105 mmol/L (ref 98–111)
Creatinine, Ser: 0.85 mg/dL (ref 0.61–1.24)
GFR, Estimated: >60 mL/min (ref >60)
Glucose, Bld: 95 mg/dL (ref 70–99)
Potassium: 4 mmol/L (ref 3.5–5.1)
Sodium: 138 mmol/L (ref 135–145)

## 2023-06-10 LAB — CBC
HCT: 40.2 % (ref 39.0–52.0)
Hemoglobin: 12.8 g/dL — ABNORMAL LOW (ref 13.0–17.0)
MCH: 29.3 pg (ref 26.0–34.0)
MCHC: 31.8 g/dL (ref 30.0–36.0)
MCV: 92 fL (ref 80.0–100.0)
Platelets: 332 10*3/uL (ref 150–400)
RBC: 4.37 MIL/uL (ref 4.22–5.81)
RDW: 14.2 % (ref 11.5–15.5)
WBC: 6.4 10*3/uL (ref 4.0–10.5)
nRBC: 0 % (ref 0.0–0.2)

## 2023-06-10 LAB — RESP PANEL BY RT-PCR (RSV, FLU A&B, COVID)  RVPGX2
Influenza A by PCR: NEGATIVE
Influenza B by PCR: NEGATIVE
Resp Syncytial Virus by PCR: POSITIVE — AB
SARS Coronavirus 2 by RT PCR: NEGATIVE

## 2023-06-10 LAB — TROPONIN I (HIGH SENSITIVITY)
Troponin I (High Sensitivity): 3 ng/L (ref <18)
Troponin I (High Sensitivity): 2 ng/L (ref <18)

## 2023-06-10 MED ORDER — AMLODIPINE BESYLATE 10 MG PO TABS: 10.0000 mg | ORAL_TABLET | Freq: Every day | ORAL | 1 refills | Status: AC

## 2023-06-10 NOTE — Discharge Instructions (Addendum)
 You have very high blood pressure.  I represcribed a medication called amlodipine  at 10 mg a day.  This medicine alone may not be enough to fix your blood pressure.  High blood pressure should be managed by primary care outpatient clinic.  Please look for a primary care provider.  Please make every effort to stop smoking.  Smoking can drive up your blood pressure and cause other serious medical problems.

## 2023-06-10 NOTE — ED Triage Notes (Signed)
 Pt here from home with c/o chest pain and sob along with a cough ,pt also with htn but has not has his meds in a while

## 2023-06-10 NOTE — ED Provider Notes (Signed)
 [facility] EMERGENCY DEPARTMENT AT [facility] Provider Note   CSN: [id number] Arrival date & time: 06/10/23  1101     History  Chief Complaint  Patient presents with   Chest Pain    Kenneth Garrison is a 34 y.o. male present to ED with several complaints.  Patient reports that for about a month he has had intermittent fevers, chills, sore throat, coughing.  Is a 61-year-old daughter was also been intermittently sick.  This week in particular he felt bad.  He also reports he has high blood pressure but is not taking his medication.  He does smoke.  HPI     Home Medications Prior to Admission medications   Medication Sig Start Date End Date Taking? Authorizing Provider  amLODipine  (NORVASC ) 10 MG tablet Take 1 tablet (10 mg total) by mouth daily. 06/10/23 07/10/23 Yes Virgil Lightner, Donnice PARAS, MD  acetaminophen  (TYLENOL ) 500 MG tablet Take 1 tablet (500 mg total) by mouth every 6 (six) hours as needed. 07/01/18   Law, Alexandra M, PA-C  ALPRAZolam (XANAX) 0.25 MG tablet Take 0.25 mg by mouth once. Pt got 2 0.25mg  tablets in order to help him sleep due to his illness as this time.    [provider]  amLODipine  (NORVASC ) 5 MG tablet Take 1 tablet (5 mg total) by mouth daily. 03/13/21   Roemhildt, Lorin T, PA-C  azithromycin  (ZITHROMAX ) 500 MG tablet Take 1 tablet (500 mg total) by mouth daily. 04/07/21   Bernis Ernst, PA-C  cetirizine -pseudoephedrine  (ZYRTEC -D) 5-120 MG tablet Take 1 tablet by mouth daily. Patient not taking: No sig reported 07/01/18   Rendell Lorane HERO, PA-C  docusate sodium  (COLACE) 100 MG capsule Take 1 capsule (100 mg total) by mouth every 12 (twelve) hours. Patient not taking: No sig reported 06/23/17   Sofia, Leslie K, PA-C  hydrocortisone  (ANUSOL -HC) 2.5 % rectal cream Apply rectally 2 times daily Patient not taking: No sig reported 06/23/17   Sofia, Leslie K, PA-C  ibuprofen  (ADVIL ,MOTRIN ) 600 MG tablet Take 1 tablet (600 mg total) by mouth every 6 (six)  hours as needed. Patient not taking: No sig reported 07/01/18   Rendell Lorane HERO, PA-C  lidocaine  (XYLOCAINE ) 2 % solution Use as directed 15 mLs in the mouth or throat as needed for mouth pain. 11/18/19   Henderly, Britni A, PA-C  loperamide  (IMODIUM ) 2 MG capsule Take 1 capsule (2 mg total) by mouth 4 (four) times daily as needed for diarrhea or loose stools. Patient not taking: No sig reported 09/03/18   Jarold Olam HERO, PA-C  ondansetron  (ZOFRAN  ODT) 4 MG disintegrating tablet Take 1 tablet (4 mg total) by mouth every 8 (eight) hours as needed for nausea. Patient not taking: No sig reported 09/03/18   Jarold Olam HERO, PA-C  sodium chloride  (OCEAN) 0.65 % SOLN nasal spray Place 1 spray into both nostrils as needed for congestion. Patient not taking: No sig reported 07/01/18   Rendell Lorane HERO, PA-C      Allergies    Penicillins    Review of Systems   Review of Systems  Physical Exam Updated Vital Signs BP (!) 177/118 (BP Location: Left Arm)   Pulse 79   Temp 98.1 F (36.7 C) (Oral)   Resp 16   SpO2 99%  Physical Exam Constitutional:      General: He is not in acute distress. HENT:     Head: Normocephalic and atraumatic.  Eyes:     Conjunctiva/sclera: Conjunctivae normal.  Pupils: Pupils are equal, round, and reactive to light.  Cardiovascular:     Rate and Rhythm: Normal rate and regular rhythm.  Pulmonary:     Effort: Pulmonary effort is normal. No respiratory distress.  Abdominal:     General: There is no distension.     Tenderness: There is no abdominal tenderness.  Skin:    General: Skin is warm and dry.  Neurological:     General: No focal deficit present.     Mental Status: He is alert. Mental status is at baseline.  Psychiatric:        Mood and Affect: Mood normal.        Behavior: Behavior normal.     ED Results / Procedures / Treatments   Labs (all labs ordered are listed, but only abnormal results are displayed) Labs Reviewed  RESP PANEL BY RT-PCR (RSV,  FLU A&B, COVID)  RVPGX2 - Abnormal; Notable for the following components:      Result Value   Resp Syncytial Virus by PCR POSITIVE (*)    All other components within normal limits  CBC - Abnormal; Notable for the following components:   Hemoglobin 12.8 (*)    All other components within normal limits  BASIC METABOLIC PANEL  TROPONIN I (HIGH SENSITIVITY)  TROPONIN I (HIGH SENSITIVITY)    EKG EKG Interpretation Date/Time:  Tuesday June 10 2023 11:38:07 EST Ventricular Rate:  68 PR Interval:  161 QRS Duration:  122 QT Interval:  423 QTC Calculation: 450 R Axis:   82  Text Interpretation: Sinus rhythm IVCD, consider atypical RBBB Confirmed by Cottie Cough [phone]) on 06/10/2023 5:32:24 PM  Radiology DG Chest 2 View Result Date: 06/10/2023 CLINICAL DATA:  Chest pain and cough EXAM: CHEST - 2 VIEW COMPARISON:  03/13/2021 FINDINGS: The heart size and mediastinal contours are within normal limits. Both lungs are clear. The visualized skeletal structures are unremarkable. IMPRESSION: No active cardiopulmonary disease. Electronically Signed   By: CHRISTELLA.  Shick M.D.   On: 06/10/2023 13:25    Procedures Procedures    Medications Ordered in ED Medications - No data to display  ED Course/ Medical Decision Making/ A&P                                 Medical Decision Making Amount and/or Complexity of Data Reviewed Labs: ordered. Radiology: ordered.   Patient is here constellation of symptoms, likely viral syndrome.  I personally viewed interpreted patient's labs and imaging.  This is notable for positive RSV test, as a likely cause of his symptoms.  His troponins are negative.  His EKG per my interpretation is nonischemic.  His chest x-ray is no focal findings.  No indication for antibiotics.  Doubt sepsis or life-threatening condition.  I recommended continue supportive care at home.  I will restart him on his blood pressure medications and I counseled him on smoking cessation.  The  patient verbalized understanding and agreement the plan        Final Clinical Impression(s) / ED Diagnoses Final diagnoses:  RSV infection  Hypertension, unspecified type    Rx / DC Orders ED Discharge Orders          Ordered    amLODipine  (NORVASC ) 10 MG tablet  Daily        06/10/23 1826              Cottie Cough PARAS, MD 06/10/23 1827

## 2023-06-27 ENCOUNTER — Other Ambulatory Visit: Payer: Self-pay

## 2023-06-27 ENCOUNTER — Emergency Department (HOSPITAL_COMMUNITY)
Admission: EM | Admit: 2023-06-27 | Discharge: 2023-06-28 | Disposition: A | Discharge status: Home / Self Care | Payer: Managed Care, Other (non HMO) | Attending: Emergency Medicine | Admitting: Emergency Medicine

## 2023-06-27 ENCOUNTER — Emergency Department (HOSPITAL_COMMUNITY): Payer: Managed Care, Other (non HMO)

## 2023-06-27 ENCOUNTER — Encounter (HOSPITAL_COMMUNITY): Payer: Self-pay

## 2023-06-27 DIAGNOSIS — M79672 Pain in left foot: Secondary | ICD-10-CM | POA: Insufficient documentation

## 2023-06-27 DIAGNOSIS — Y990 Civilian activity done for income or pay: Secondary | ICD-10-CM | POA: Insufficient documentation

## 2023-06-27 DIAGNOSIS — W208XXA Other cause of strike by thrown, projected or falling object, initial encounter: Secondary | ICD-10-CM | POA: Insufficient documentation

## 2023-06-27 NOTE — ED Provider Triage Note (Cosign Needed)
Emergency Medicine Provider Triage Evaluation Note  Kenneth Garrison , a 33 y.o. male  was evaluated in triage.  Pt complains of foot pain.  Review of Systems  Positive:  Negative:   Physical Exam  BP (!) 175/123 (BP Location: Right Arm)   Pulse 93   Temp 98.4 F (36.9 C) (Oral)   Resp 18   Ht 6' (1.829 m)   Wt 89.8 kg   SpO2 99%   BMI 26.85 kg/m  Gen:   Awake, no distress   Resp:  Normal effort  MSK:   Moves extremities without difficulty  Other:    Medical Decision Making  Medically screening exam initiated at 5:54 PM.  Appropriate orders placed.  Jacky Dross was informed that the remainder of the evaluation will be completed by another provider, this initial triage assessment does not replace that evaluation, and the importance of remaining in the ED until their evaluation is complete.  Left foot pain x5 days after dropping heavy object on it. States that it is getting harder to walk. No infectious symptoms.   Dorthy Cooler, [place] 06/27/23 1755

## 2023-06-27 NOTE — ED Triage Notes (Signed)
Pt to ED c/o left foot injury. Reports 5 days ago was at work and dropped a case of can goods on left foot, reports pain with walking ever since.

## 2023-06-28 NOTE — Discharge Instructions (Signed)
Your x-ray today was normal. You can arrange follow-up with orthopedics if ongoing issues. Tylenol and Motrin as needed for pain. Return here for new concerns.

## 2023-06-28 NOTE — ED Provider Notes (Signed)
EMERGENCY DEPARTMENT AT [facility] Provider Note   CSN: [id number] Arrival date & time: 06/27/23  1608     History  Chief Complaint  Patient presents with   Foot Injury    left    Kenneth Garrison is a 33 y.o. male.  The history is provided by the patient and medical records.  Foot Injury  33 year old male presenting to the ED with left foot pain.  Dropped some pain meds on his foot a few days ago at work.  Since then he feels like he is having a lot of trouble walking and putting pressure on his foot.  He denies any numbness or tingling.  He remains ambulatory.  He has not tried anything for intervention prior to arrival.  Home Medications Prior to Admission medications   Medication Sig Start Date End Date Taking? Authorizing Provider  acetaminophen (TYLENOL) 500 MG tablet Take 1 tablet (500 mg total) by mouth every 6 (six) hours as needed. 07/01/18   Law, Waylan Boga, PA-C  ALPRAZolam Prudy Feeler) 0.25 MG tablet Take 0.25 mg by mouth once. Pt got 2 0.25mg  tablets in order to help him sleep due to his illness as this time.    [provider]  amLODipine (NORVASC) 10 MG tablet Take 1 tablet (10 mg total) by mouth daily. 06/10/23 07/10/23  Terald Sleeper, MD  amLODipine (NORVASC) 5 MG tablet Take 1 tablet (5 mg total) by mouth daily. 03/13/21   Roemhildt, Lorin T, PA-C  azithromycin (ZITHROMAX) 500 MG tablet Take 1 tablet (500 mg total) by mouth daily. 04/07/21   Achille Rich, PA-C  cetirizine-pseudoephedrine (ZYRTEC-D) 5-120 MG tablet Take 1 tablet by mouth daily. Patient not taking: No sig reported 07/01/18   Emi Holes, PA-C  docusate sodium (COLACE) 100 MG capsule Take 1 capsule (100 mg total) by mouth every 12 (twelve) hours. Patient not taking: No sig reported 06/23/17   Elson Areas, PA-C  hydrocortisone (ANUSOL-HC) 2.5 % rectal cream Apply rectally 2 times daily Patient not taking: No sig reported 06/23/17   Elson Areas, PA-C  ibuprofen  (ADVIL,MOTRIN) 600 MG tablet Take 1 tablet (600 mg total) by mouth every 6 (six) hours as needed. Patient not taking: No sig reported 07/01/18   Emi Holes, PA-C  lidocaine (XYLOCAINE) 2 % solution Use as directed 15 mLs in the mouth or throat as needed for mouth pain. 11/18/19   Henderly, Britni A, PA-C  loperamide (IMODIUM) 2 MG capsule Take 1 capsule (2 mg total) by mouth 4 (four) times daily as needed for diarrhea or loose stools. Patient not taking: No sig reported 09/03/18   Garlon Hatchet, PA-C  ondansetron (ZOFRAN ODT) 4 MG disintegrating tablet Take 1 tablet (4 mg total) by mouth every 8 (eight) hours as needed for nausea. Patient not taking: No sig reported 09/03/18   Garlon Hatchet, PA-C  sodium chloride (OCEAN) 0.65 % SOLN nasal spray Place 1 spray into both nostrils as needed for congestion. Patient not taking: No sig reported 07/01/18   Emi Holes, PA-C      Allergies    Penicillins    Review of Systems   Review of Systems  Musculoskeletal:  Positive for arthralgias.  All other systems reviewed and are negative.   Physical Exam Updated Vital Signs BP (!) 178/119 (BP Location: Left Arm)   Pulse 76   Temp 98.4 F (36.9 C) (Oral)   Resp 17   Ht 6' (1.829 m)  Wt 89.8 kg   SpO2 99%   BMI 26.85 kg/m  Physical Exam Vitals and nursing note reviewed.  Constitutional:      Appearance: He is well-developed.  HENT:     Head: Normocephalic and atraumatic.  Eyes:     Conjunctiva/sclera: Conjunctivae normal.     Pupils: Pupils are equal, round, and reactive to light.  Cardiovascular:     Rate and Rhythm: Normal rate and regular rhythm.     Heart sounds: Normal heart sounds.  Pulmonary:     Effort: Pulmonary effort is normal.     Breath sounds: Normal breath sounds.  Abdominal:     General: Bowel sounds are normal.     Palpations: Abdomen is soft.  Musculoskeletal:        General: Normal range of motion.     Cervical back: Normal range of motion.     Comments:  Left foot is normal in appearance without any visible swelling, bruising, or acute deformity, DP pulse intact, moving toes as normal, normal sensation throughout  Skin:    General: Skin is warm and dry.  Neurological:     Mental Status: He is alert and oriented to person, place, and time.     ED Results / Procedures / Treatments   Labs (all labs ordered are listed, but only abnormal results are displayed) Labs Reviewed - No data to display  EKG None  Radiology DG Foot Complete Left Result Date: 06/27/2023 CLINICAL DATA:  Left foot pain after trauma EXAM: LEFT FOOT - COMPLETE 3+ VIEW COMPARISON:  None Available. FINDINGS: There is no evidence of fracture or dislocation. There is no evidence of arthropathy or other focal bone abnormality. Soft tissues are unremarkable. IMPRESSION: Negative. Electronically Signed   By: Duanne Guess D.O.   On: 06/27/2023 18:55    Procedures Procedures    Medications Ordered in ED Medications - No data to display  ED Course/ Medical Decision Making/ A&P                                 Medical Decision Making  33 year old male here with left foot pain after dropping canned goods on it a few days ago at work.  No acute deformities or signs of trauma noted on exam.  X-rays negative.  Feels like he is having trouble walking.  He is given postop shoe and crutches.  Will refer to orthopedics if ongoing issues.  Recommended to initiate anti-inflammatories.  Can return here for new concerns.  Final Clinical Impression(s) / ED Diagnoses Final diagnoses:  Foot pain, left    Rx / DC Orders ED Discharge Orders     None         Garlon Hatchet, PA-C 06/28/23 0046    Gilda Crease, MD 06/28/23 [phone]
